# Patient Record
Sex: Female | Born: 1975 | Race: Black or African American | Hispanic: No | Marital: Single | Smoking: Never smoker
Health system: Southern US, Community
[De-identification: ages and names within clinical notes are randomized; demographics above are authoritative.]

## PROBLEM LIST (undated history)

## (undated) DIAGNOSIS — D259 Leiomyoma of uterus, unspecified: Secondary | ICD-10-CM

## (undated) DIAGNOSIS — I10 Essential (primary) hypertension: Secondary | ICD-10-CM

## (undated) DIAGNOSIS — K5790 Diverticulosis of intestine, part unspecified, without perforation or abscess without bleeding: Secondary | ICD-10-CM

## (undated) DIAGNOSIS — J45909 Unspecified asthma, uncomplicated: Secondary | ICD-10-CM

## (undated) DIAGNOSIS — E119 Type 2 diabetes mellitus without complications: Secondary | ICD-10-CM

## (undated) HISTORY — DX: Leiomyoma of uterus, unspecified: D25.9

## (undated) HISTORY — PX: HYSTEROTOMY: SHX1776

## (undated) HISTORY — PX: UTERINE FIBROID SURGERY: SHX826

## (undated) HISTORY — DX: Diverticulosis of intestine, part unspecified, without perforation or abscess without bleeding: K57.90

## (undated) HISTORY — DX: Type 2 diabetes mellitus without complications: E11.9

## (undated) HISTORY — PX: HERNIA REPAIR: SHX51

## (undated) HISTORY — DX: Essential (primary) hypertension: I10

## (undated) HISTORY — DX: Unspecified asthma, uncomplicated: J45.909

---

## 2001-03-13 ENCOUNTER — Encounter: Payer: Self-pay | Admitting: Family Medicine

## 2001-03-13 ENCOUNTER — Encounter: Admission: RE | Admit: 2001-03-13 | Discharge: 2001-03-13 | Payer: Self-pay | Admitting: Family Medicine

## 2001-06-15 ENCOUNTER — Encounter: Payer: Self-pay | Admitting: Family Medicine

## 2001-06-15 ENCOUNTER — Encounter: Admission: RE | Admit: 2001-06-15 | Discharge: 2001-06-15 | Payer: Self-pay | Admitting: Family Medicine

## 2001-07-02 ENCOUNTER — Other Ambulatory Visit: Admission: RE | Admit: 2001-07-02 | Discharge: 2001-07-02 | Payer: Self-pay | Admitting: Gynecology

## 2002-02-19 ENCOUNTER — Encounter: Admission: RE | Admit: 2002-02-19 | Discharge: 2002-02-19 | Payer: Self-pay | Admitting: Family Medicine

## 2002-02-19 ENCOUNTER — Encounter: Payer: Self-pay | Admitting: Family Medicine

## 2009-05-08 ENCOUNTER — Encounter: Payer: Self-pay | Admitting: Obstetrics and Gynecology

## 2009-05-08 ENCOUNTER — Ambulatory Visit (HOSPITAL_COMMUNITY): Admission: RE | Admit: 2009-05-08 | Discharge: 2009-05-09 | Payer: Self-pay | Admitting: Obstetrics and Gynecology

## 2010-08-28 LAB — CBC
HCT: 30.6 % — ABNORMAL LOW (ref 36.0–46.0)
HCT: 36.1 % (ref 36.0–46.0)
Hemoglobin: 11.5 g/dL — ABNORMAL LOW (ref 12.0–15.0)
Hemoglobin: 9.9 g/dL — ABNORMAL LOW (ref 12.0–15.0)
MCHC: 31.7 g/dL (ref 30.0–36.0)
MCHC: 32.2 g/dL (ref 30.0–36.0)
MCV: 77.1 fL — ABNORMAL LOW (ref 78.0–100.0)
MCV: 78.2 fL (ref 78.0–100.0)
Platelets: 339 10*3/uL (ref 150–400)
Platelets: 350 10*3/uL (ref 150–400)
RBC: 3.92 MIL/uL (ref 3.87–5.11)
RBC: 4.68 MIL/uL (ref 3.87–5.11)
RDW: 17.4 % — ABNORMAL HIGH (ref 11.5–15.5)
RDW: 17.6 % — ABNORMAL HIGH (ref 11.5–15.5)
WBC: 10.1 10*3/uL (ref 4.0–10.5)
WBC: 7.2 10*3/uL (ref 4.0–10.5)

## 2010-08-28 LAB — PREGNANCY, URINE: Preg Test, Ur: NEGATIVE

## 2016-11-08 DIAGNOSIS — J4520 Mild intermittent asthma, uncomplicated: Secondary | ICD-10-CM | POA: Diagnosis not present

## 2016-11-08 DIAGNOSIS — Z6836 Body mass index (BMI) 36.0-36.9, adult: Secondary | ICD-10-CM | POA: Diagnosis not present

## 2016-11-08 DIAGNOSIS — I10 Essential (primary) hypertension: Secondary | ICD-10-CM | POA: Diagnosis not present

## 2016-11-08 DIAGNOSIS — E119 Type 2 diabetes mellitus without complications: Secondary | ICD-10-CM | POA: Diagnosis not present

## 2017-04-28 DIAGNOSIS — R110 Nausea: Secondary | ICD-10-CM | POA: Diagnosis not present

## 2017-04-28 DIAGNOSIS — I10 Essential (primary) hypertension: Secondary | ICD-10-CM | POA: Diagnosis not present

## 2017-04-28 DIAGNOSIS — R5381 Other malaise: Secondary | ICD-10-CM | POA: Diagnosis not present

## 2017-04-28 DIAGNOSIS — J4520 Mild intermittent asthma, uncomplicated: Secondary | ICD-10-CM | POA: Diagnosis not present

## 2017-04-30 DIAGNOSIS — J029 Acute pharyngitis, unspecified: Secondary | ICD-10-CM | POA: Diagnosis not present

## 2017-05-01 DIAGNOSIS — J4520 Mild intermittent asthma, uncomplicated: Secondary | ICD-10-CM | POA: Diagnosis not present

## 2017-05-01 DIAGNOSIS — J00 Acute nasopharyngitis [common cold]: Secondary | ICD-10-CM | POA: Diagnosis not present

## 2017-06-20 DIAGNOSIS — I10 Essential (primary) hypertension: Secondary | ICD-10-CM | POA: Diagnosis not present

## 2017-06-20 DIAGNOSIS — J4520 Mild intermittent asthma, uncomplicated: Secondary | ICD-10-CM | POA: Diagnosis not present

## 2017-06-20 DIAGNOSIS — E119 Type 2 diabetes mellitus without complications: Secondary | ICD-10-CM | POA: Diagnosis not present

## 2017-06-20 DIAGNOSIS — Z6836 Body mass index (BMI) 36.0-36.9, adult: Secondary | ICD-10-CM | POA: Diagnosis not present

## 2017-08-08 DIAGNOSIS — H5213 Myopia, bilateral: Secondary | ICD-10-CM | POA: Diagnosis not present

## 2017-08-08 DIAGNOSIS — E119 Type 2 diabetes mellitus without complications: Secondary | ICD-10-CM | POA: Diagnosis not present

## 2017-08-08 DIAGNOSIS — H04123 Dry eye syndrome of bilateral lacrimal glands: Secondary | ICD-10-CM | POA: Diagnosis not present

## 2017-09-26 DIAGNOSIS — Z1231 Encounter for screening mammogram for malignant neoplasm of breast: Secondary | ICD-10-CM | POA: Diagnosis not present

## 2017-09-26 DIAGNOSIS — J4520 Mild intermittent asthma, uncomplicated: Secondary | ICD-10-CM | POA: Diagnosis not present

## 2017-09-26 DIAGNOSIS — I10 Essential (primary) hypertension: Secondary | ICD-10-CM | POA: Diagnosis not present

## 2017-09-26 DIAGNOSIS — E119 Type 2 diabetes mellitus without complications: Secondary | ICD-10-CM | POA: Diagnosis not present

## 2017-10-22 DIAGNOSIS — L98491 Non-pressure chronic ulcer of skin of other sites limited to breakdown of skin: Secondary | ICD-10-CM | POA: Diagnosis not present

## 2017-10-22 DIAGNOSIS — I10 Essential (primary) hypertension: Secondary | ICD-10-CM | POA: Diagnosis not present

## 2017-10-22 DIAGNOSIS — R1032 Left lower quadrant pain: Secondary | ICD-10-CM | POA: Diagnosis not present

## 2017-10-30 DIAGNOSIS — K5730 Diverticulosis of large intestine without perforation or abscess without bleeding: Secondary | ICD-10-CM | POA: Diagnosis not present

## 2017-10-30 DIAGNOSIS — R1032 Left lower quadrant pain: Secondary | ICD-10-CM | POA: Diagnosis not present

## 2017-11-03 DIAGNOSIS — T8130XD Disruption of wound, unspecified, subsequent encounter: Secondary | ICD-10-CM | POA: Diagnosis not present

## 2017-11-03 DIAGNOSIS — L98499 Non-pressure chronic ulcer of skin of other sites with unspecified severity: Secondary | ICD-10-CM | POA: Diagnosis not present

## 2017-11-03 DIAGNOSIS — L98492 Non-pressure chronic ulcer of skin of other sites with fat layer exposed: Secondary | ICD-10-CM | POA: Diagnosis not present

## 2017-11-03 DIAGNOSIS — E11622 Type 2 diabetes mellitus with other skin ulcer: Secondary | ICD-10-CM | POA: Diagnosis not present

## 2017-11-04 DIAGNOSIS — L98492 Non-pressure chronic ulcer of skin of other sites with fat layer exposed: Secondary | ICD-10-CM | POA: Diagnosis not present

## 2017-12-19 DIAGNOSIS — I10 Essential (primary) hypertension: Secondary | ICD-10-CM | POA: Diagnosis not present

## 2017-12-19 DIAGNOSIS — Z7984 Long term (current) use of oral hypoglycemic drugs: Secondary | ICD-10-CM | POA: Diagnosis not present

## 2017-12-19 DIAGNOSIS — E119 Type 2 diabetes mellitus without complications: Secondary | ICD-10-CM | POA: Diagnosis not present

## 2017-12-19 DIAGNOSIS — Z6835 Body mass index (BMI) 35.0-35.9, adult: Secondary | ICD-10-CM | POA: Diagnosis not present

## 2017-12-26 MED FILL — METFORMIN HCL ER 500 MG TAB: 500 | 90 days supply | Qty: 360 | Fill #0

## 2017-12-29 MED FILL — OLMESARTAN-HCTZ 40-25 MG TA: 40-25 | 90 days supply | Qty: 90 | Fill #0

## 2018-03-27 MED FILL — metFORMIN HCL ER 500 MG TB2: 500 | 90 days supply | Qty: 360 | Fill #1

## 2018-03-27 MED FILL — OLMESARTAN-HCTZ 40-25 MG TA: 40-25 | 90 days supply | Qty: 90 | Fill #1

## 2018-05-08 DIAGNOSIS — R1032 Left lower quadrant pain: Secondary | ICD-10-CM | POA: Diagnosis not present

## 2018-05-08 DIAGNOSIS — L98491 Non-pressure chronic ulcer of skin of other sites limited to breakdown of skin: Secondary | ICD-10-CM | POA: Diagnosis not present

## 2018-05-08 DIAGNOSIS — Z7984 Long term (current) use of oral hypoglycemic drugs: Secondary | ICD-10-CM | POA: Diagnosis not present

## 2018-05-08 DIAGNOSIS — E119 Type 2 diabetes mellitus without complications: Secondary | ICD-10-CM | POA: Diagnosis not present

## 2018-05-08 DIAGNOSIS — I10 Essential (primary) hypertension: Secondary | ICD-10-CM | POA: Diagnosis not present

## 2018-05-08 DIAGNOSIS — Z6835 Body mass index (BMI) 35.0-35.9, adult: Secondary | ICD-10-CM | POA: Diagnosis not present

## 2018-05-21 ENCOUNTER — Encounter: Payer: Self-pay | Admitting: Gastroenterology

## 2018-05-23 ENCOUNTER — Encounter (HOSPITAL_BASED_OUTPATIENT_CLINIC_OR_DEPARTMENT_OTHER): Payer: Self-pay

## 2018-05-26 ENCOUNTER — Encounter (HOSPITAL_BASED_OUTPATIENT_CLINIC_OR_DEPARTMENT_OTHER): Payer: Self-pay

## 2018-05-26 ENCOUNTER — Encounter (HOSPITAL_BASED_OUTPATIENT_CLINIC_OR_DEPARTMENT_OTHER): Payer: 59 | Attending: Internal Medicine

## 2018-05-26 DIAGNOSIS — Y838 Other surgical procedures as the cause of abnormal reaction of the patient, or of later complication, without mention of misadventure at the time of the procedure: Secondary | ICD-10-CM | POA: Diagnosis not present

## 2018-05-26 DIAGNOSIS — E119 Type 2 diabetes mellitus without complications: Secondary | ICD-10-CM | POA: Insufficient documentation

## 2018-05-26 DIAGNOSIS — T8189XA Other complications of procedures, not elsewhere classified, initial encounter: Secondary | ICD-10-CM | POA: Diagnosis not present

## 2018-05-26 DIAGNOSIS — Z90710 Acquired absence of both cervix and uterus: Secondary | ICD-10-CM | POA: Diagnosis not present

## 2018-05-26 DIAGNOSIS — I10 Essential (primary) hypertension: Secondary | ICD-10-CM | POA: Diagnosis not present

## 2018-05-26 DIAGNOSIS — T8131XA Disruption of external operation (surgical) wound, not elsewhere classified, initial encounter: Secondary | ICD-10-CM | POA: Diagnosis not present

## 2018-05-26 DIAGNOSIS — Z9889 Other specified postprocedural states: Secondary | ICD-10-CM | POA: Diagnosis not present

## 2018-05-28 DIAGNOSIS — L98492 Non-pressure chronic ulcer of skin of other sites with fat layer exposed: Secondary | ICD-10-CM | POA: Diagnosis not present

## 2018-06-02 ENCOUNTER — Encounter (HOSPITAL_BASED_OUTPATIENT_CLINIC_OR_DEPARTMENT_OTHER): Payer: 59 | Attending: Internal Medicine

## 2018-06-18 NOTE — Progress Notes (Deleted)
[facility] Consult Note:  History: Theresa Briggs 06/19/2018  Referring physician: No primary care provider on file.  Reason for consult/chief complaint: No chief complaint on file.   Subjective  HPI:  ***  Referred by her [facility] primary care provider after December 2019 office visit complaining of intermittent left lower quadrant pain.  That note indicates "negative GYN work-up". ROS:  Review of Systems   Past Medical History: Past Medical History:  Diagnosis Date  . Asthma   . Hypertension   . Type 2 diabetes mellitus ([place])   . Uterine fibroid      Past Surgical History: *** The histories are not reviewed yet. Please review them in the "History" navigator section and refresh this K[place].   Family History: Family History  Problem Relation Age of Onset  . Diabetes Mother   . Hypertension Mother   . Hypertension Father   . Diabetes Paternal Grandmother     Social History: Social History   Socioeconomic History  . Marital status: Single    Spouse name: Not on file  . Number of children: Not on file  . Years of education: Not on file  . Highest education level: Not on file  Occupational History  . Not on file  Social Needs  . Financial resource strain: Not on file  . Food insecurity:    Worry: Not on file    Inability: Not on file  . Transportation needs:    Medical: Not on file    Non-medical: Not on file  Tobacco Use  . Smoking status: Never Smoker  . Smokeless tobacco: Never Used  Substance and Sexual Activity  . Alcohol use: Not on file  . Drug use: Not on file  . Sexual activity: Not on file  Lifestyle  . Physical activity:    Days per week: Not on file    Minutes per session: Not on file  . Stress: Not on file  Relationships  . Social connections:    Talks on phone: Not on file    Gets together: Not on file    Attends religious service: Not on file    Active member of club or organization: Not on file   Attends meetings of clubs or organizations: Not on file    Relationship status: Not on file  Other Topics Concern  . Not on file  Social History Narrative  . Not on file    Allergies: Allergies not on file  Outpatient Meds: No current outpatient medications on file.   No current facility-administered medications for this visit.       ___________________________________________________________________ Objective   Exam:  There were no vitals taken for this visit.   General: this is a(n) ***   Eyes: sclera anicteric, no redness  ENT: oral mucosa moist without lesions, no cervical or supraclavicular lymphadenopathy  CV: RRR without murmur, S1/S2, no JVD, no peripheral edema  Resp: clear to auscultation bilaterally, normal RR and effort noted  GI: soft, *** tenderness, with active bowel sounds. No guarding or palpable organomegaly noted.  Skin; warm and dry, no rash or jaundice noted  Neuro: awake, alert and oriented x 3. Normal gross motor function and fluent speech  Labs:  Recent hemoglobin A1c 6.3 (similar values over several years)  01/13/2018 CBC: Hemoglobin 12, MCV 76  Radiologic Studies:  CT abdomen and pelvis June 2019 (through care everywhere)  CLINICAL DATA: Left lower quadrant pain for 5 days  EXAM: CT ABDOMEN AND PELVIS WITH CONTRAST  TECHNIQUE: Multidetector CT imaging of the abdomen and pelvis was performed using the standard protocol following bolus administration of intravenous contrast.  CONTRAST: 100 mL of Omnipaque 350  COMPARISON: None.  FINDINGS: Lower chest: No acute abnormality.  Hepatobiliary: Hepatic steatosis. No focal mass. The gallbladder is decompressed but unremarkable. The portal vein is patent.  Pancreas: Unremarkable. No pancreatic ductal dilatation or surrounding inflammatory changes.  Spleen: Normal in size without focal abnormality.  Adrenals/Urinary Tract: Adrenal glands are unremarkable. Kidneys  are normal, without renal calculi, focal lesion, or hydronephrosis. Bladder is unremarkable.  Stomach/Bowel: The stomach and small bowel are normal. Colonic diverticulosis is seen without diverticulitis. The appendix is normal.  Vascular/Lymphatic: No significant vascular findings are present. No enlarged abdominal or pelvic lymph nodes.  Reproductive: Status post hysterectomy. No adnexal masses.  Other: Previous hernia repair scarring in the anterior abdominal wall. No free air or free fluid.  Musculoskeletal: No acute or significant osseous findings.  IMPRESSION: 1. No cause for left lower quadrant pain identified. No diverticulitis. Diverticulosis. 2. Signs of previous abdominal hernia repair.   Electronically Signed  By: Dorise Bullion III M.D  On: 10/30/2017 15:59  Assessment: No diagnosis found.  ***  Plan:  ***  Thank you for the courtesy of this consult.  Please call me with any questions or concerns.  Nelida Meuse III  CC: Referring provider noted above

## 2018-06-19 ENCOUNTER — Ambulatory Visit: Payer: Self-pay | Admitting: Gastroenterology

## 2018-07-02 MED FILL — metFORMIN HCL ER 500 MG TB2: 500 | 90 days supply | Qty: 360 | Fill #2 | Status: TO

## 2018-07-02 MED FILL — OLMESARTAN-HCTZ 40-25 MG TA: 40-25 | 90 days supply | Qty: 90 | Fill #2 | Status: TO

## 2018-08-03 ENCOUNTER — Encounter: Payer: Self-pay | Admitting: Gastroenterology

## 2018-08-18 ENCOUNTER — Other Ambulatory Visit: Payer: Self-pay

## 2018-08-18 ENCOUNTER — Telehealth (INDEPENDENT_AMBULATORY_CARE_PROVIDER_SITE_OTHER): Payer: 59 | Admitting: Gastroenterology

## 2018-08-18 DIAGNOSIS — R1032 Left lower quadrant pain: Secondary | ICD-10-CM

## 2018-08-18 NOTE — Patient Instructions (Addendum)
We have sent the following medications to your pharmacy for you to pick up at your convenience: Levsin 0.125mg  as needed every 4 hours as needed.  We will contact you to schedule your EGD/Colonoscopy in 4-6 weeks.  It was a pleasure to see you today!  Vito Cirigliano, D.O.

## 2018-08-18 NOTE — Progress Notes (Signed)
              Chief Complaint: Abdominal pain, LLQ   Referring Provider:     Waverly Ferrari, MD   HPI:    Due to current restrictions/limitations of in office visits due to COVID-19, this scheduled clinical appointment was converted to a telehealth consultation via telephone.  -Time of medical discussion: 22 minutes -Patient consented to the consult via telephone -Names of all parties present: Theresa Briggs (patient), Gerrit Heck, DO, [facility] (physician)  Theresa Briggs is a 43 y.o. female referred to the Gastroenterology Clinic for evaluation of LLQ pain x1+ year. Pain is intermittent and unrelated to PO intake, activity, time of day, etc. Described as sharp pain. Prescribed Mobic 7.5 mg, initially with clinical improvement, but now no improvement. Sxs occur at random, lasting 30 mins. CT in 10/2017 with diverticulosis only. No known triggers. No radiation. No associated n/v/d/c/f/c. No hematochezia or melena.   Has had fibroid surgery x2, abdominal hernia repair, then hysterectomy for recurrence of fibroids. Labs in 09/2017 with mild microcytic anemia (11.9/35.4 with MCV 76), normal CMP in 04/2018. No previous colonoscopy. Had an EGD many years ago and unrelated to this issue.   No known family history of CRC, GI malignancy, liver disease, pancreatic disease, or IBD.   Past medical history, past surgical history, social history, family history, medications, and allergies reviewed in the chart and with patient over the phone.  Past Medical History:  Diagnosis Date  . Asthma   . Hypertension   . Type 2 diabetes mellitus ([place])   . Uterine fibroid       Family History  Problem Relation Age of Onset  . Diabetes Mother   . Hypertension Mother   . Hypertension Father   . Diabetes Paternal Grandmother    Social History   Tobacco Use  . Smoking status: Never Smoker  . Smokeless tobacco: Never Used  Substance Use Topics  . Alcohol use: Not on file  . Drug use: Not on file    No current outpatient medications on file.   No current facility-administered medications for this visit.    Not on File   Review of Systems: All systems reviewed and negative except where noted in HPI.     Physical Exam:    Physical exam not completed due to the nature of this telehealth communication.  Patient was otherwise alert and oriented and well communicative.   ASSESSMENT AND PLAN;   [name] is a 43 y.o. female presenting with:  1) LLQ Pain: Intermittent, nonlimiting LLQ pain without associated symptoms over the last year plus.  Discussed the broad DDX to include GI and GU etiologies along with MSK etiologies and will plan to evaluate and treat further as below: - Trial course of Levsin for diagnostic and therapeutic intent - Check inflammatory markers - No evidence of diverticulitis noted on CT at the time of active symptoms last year.  Have a low threshold if this is recurrent diverticulitis or SCAD. - Discussed colonoscopy to evaluate for additional mucosal/luminal etiology.  Given the current endoscopic limitations due to COVID-19 pandemic, plan to do colonoscopy in 4 to 6 weeks when limitations are expected to cease.  Given chronic nature and otherwise feeling well, no need to expedite as urgent/emergent. -If GI evaluation unrevealing, potentially refer to GYN for additional evaluation   Dominic Pea Gradie Ohm, DO, FACG  08/18/2018, 5:13 PM   No ref. provider found

## 2018-08-19 ENCOUNTER — Other Ambulatory Visit: Payer: 59

## 2018-08-21 ENCOUNTER — Other Ambulatory Visit: Payer: Self-pay

## 2018-08-21 ENCOUNTER — Other Ambulatory Visit: Payer: Self-pay | Admitting: Gastroenterology

## 2018-08-21 MED ORDER — HYOSCYAMINE SULFATE 0.125 MG SL SUBL: 0.1250 mg | SUBLINGUAL_TABLET | SUBLINGUAL | 1 refills | Status: DC | PRN

## 2018-08-21 NOTE — Progress Notes (Signed)
Rx sent to pharmacy   

## 2018-08-21 NOTE — Telephone Encounter (Signed)
Medication has been sent into patients pharmacy 

## 2018-08-21 NOTE — Telephone Encounter (Signed)
Pt had televisit with pt and was told that Levsin was going to be sent to her pharmacy but it has not yet. She uses [facility]  on [address] in [place].

## 2018-08-27 NOTE — Telephone Encounter (Signed)
Please advise 

## 2018-08-27 NOTE — Telephone Encounter (Signed)
Pt stated that the Levsin prescribed works "as a quick fix but does not cover her the entire 4 hours." She reported that she still gets the same pain.

## 2018-08-28 ENCOUNTER — Telehealth: Payer: Self-pay | Admitting: Gastroenterology

## 2018-08-31 ENCOUNTER — Other Ambulatory Visit (INDEPENDENT_AMBULATORY_CARE_PROVIDER_SITE_OTHER): Payer: 59

## 2018-08-31 ENCOUNTER — Other Ambulatory Visit: Payer: Self-pay

## 2018-08-31 DIAGNOSIS — R1032 Left lower quadrant pain: Secondary | ICD-10-CM

## 2018-08-31 LAB — SEDIMENTATION RATE: Sed Rate: 34 mm/hr — ABNORMAL HIGH (ref 0–20)

## 2018-08-31 LAB — C-REACTIVE PROTEIN: CRP: 1.4 mg/dL (ref 0.5–20.0)

## 2018-08-31 MED FILL — metFORMIN HCL ER 500 MG TB2: 500 | 90 days supply | Qty: 360 | Fill #0

## 2018-08-31 MED FILL — OLMESARTAN-HCTZ 40-25 MG TA: 40-25 | 90 days supply | Qty: 90 | Fill #0

## 2018-08-31 NOTE — Telephone Encounter (Signed)
I called Ms. Zeiger.  States that the [place] works, but only lasts for about an hour, then return of index LLQ pain.  Still no association with p.o. intake or bowel habits.  Otherwise in her usual state of health.  - Increase dose to 0.25 mg taken every 4-6 hours for pain -Advised to go to the lab for ESR/CRP as previously ordered - If inflammatory markers normal pain continues despite higher dose of Levsin, plan to change to alternate medication (i.e. Bentyl). -As previously discussed, plan on endoscopic evaluation once current restrictions related to COVID-19 pandemic are listed - Symptoms not currently related to p.o. intake and not making any dietary modifications, but would likely benefit from low FODMAP diet. - If symptoms still somewhat refractory, plan for in office exam followed by consideration for referral to GYN for alternate etiology and back to [facility] for reconsideration of MSK etiology

## 2018-08-31 NOTE — Telephone Encounter (Signed)
Pt stated that Levsin is not helping and that she is in immense pain. Please return her call.

## 2018-08-31 NOTE — Telephone Encounter (Signed)
Dr. Bryan Lemma spoke with the patient.  She thanked me for the call

## 2018-09-09 ENCOUNTER — Telehealth: Payer: Self-pay | Admitting: Gastroenterology

## 2018-09-10 NOTE — Telephone Encounter (Signed)
Patient called with c/o still having bad pain in her LLQ intermittently even with the Hyoscyamine. States she has even been taking 2 tabs at a time with no relief. Took  2 tabs twice yesterday, but none today so far. Offered patient a virtual phone visit with you this afternoon, but she did not want that. Would like to know if she can do a higher dose or try a different med. Please advise

## 2018-09-14 ENCOUNTER — Telehealth: Payer: Self-pay | Admitting: Gastroenterology

## 2018-09-14 NOTE — Telephone Encounter (Signed)
Hughie Closs, RN       11:20 AM  Note    Patient called with c/o still having bad pain in her LLQ intermittently even with the Hyoscyamine. States she has even been taking 2 tabs at a time with no relief. Took  2 tabs twice yesterday, but none today so far. Offered patient a virtual phone visit with you this afternoon, but she did not want that. Would like to know if she can do a higher dose or try a different med. Please advise     Patient calling back again, see note above. Please advise.

## 2018-09-14 NOTE — Telephone Encounter (Signed)
Called [place] this afternoon.  She is continuing to have intermittent LLQ pain, unchanged in location, intensity from previous.  Pain is been present for more than a year now.  CT in 09/2017 at the time of pain was only notable for diverticulosis, without diverticulitis.  She has trialed Levsin for a couple weeks now, without any clinical improvement.  Only has very limited pain relief with Rx.  Mildly elevated ESR with normal CRP.  Otherwise tolerating p.o. intake.  No fevers or chills.  Discussed the DDX for her intermittent LLQ pain to include abdominal wall syndrome, IBS, GYN etiology, and will proceed as below:  - In office exam with me next week (currently in the hospital week) -Stop Levsin -Start Bentyl 20 mg every 6 hours as needed for abdominal pain.  #60.  Refill 1. - Please place a referral to GYN for evaluation - Exam next week unrevealing and pending GYN evaluation, may need to consider cross-sectional imaging or abdominal ultrasound - If ongoing symptoms, plan for colonoscopy when able following COVID-19 pandemic restrictions -All questions answered

## 2018-09-15 ENCOUNTER — Other Ambulatory Visit: Payer: Self-pay

## 2018-09-15 DIAGNOSIS — R1032 Left lower quadrant pain: Secondary | ICD-10-CM

## 2018-09-15 MED ORDER — DICYCLOMINE HCL 10 MG PO CAPS: 20.0000 mg | ORAL_CAPSULE | Freq: Four times a day (QID) | ORAL | 1 refills | Status: DC | PRN

## 2018-09-15 NOTE — Telephone Encounter (Signed)
Called patient and notified to stop Levsin. Order put in for Bentyl and patient advised to pick-up. Referral made to [facility] with Dr. Basil Dess for 09/24/18 @11 :30am

## 2018-09-17 ENCOUNTER — Other Ambulatory Visit: Payer: Self-pay

## 2018-09-17 ENCOUNTER — Ambulatory Visit: Payer: Self-pay | Admitting: Gynecology

## 2018-09-18 ENCOUNTER — Ambulatory Visit: Payer: Self-pay | Admitting: Gynecology

## 2018-09-22 ENCOUNTER — Ambulatory Visit: Payer: 59 | Admitting: Gastroenterology

## 2018-09-23 NOTE — Progress Notes (Signed)
Called patient reference her cancelling her GYN referral. She stated she has been feeling much better since being on the Bentyl and so she didn't think she needed to see a GYN Dr. Now.

## 2018-09-25 ENCOUNTER — Ambulatory Visit: Payer: Self-pay | Admitting: Gynecology

## 2018-10-06 ENCOUNTER — Ambulatory Visit: Payer: 59 | Admitting: Gastroenterology

## 2018-12-23 ENCOUNTER — Ambulatory Visit: Payer: 59 | Admitting: Gastroenterology

## 2019-01-13 MED FILL — OLMESARTAN-HCTZ 40-25 MG TA: 40-25 | 90 days supply | Qty: 90 | Fill #0

## 2019-01-15 MED FILL — metFORMIN HCL ER 500 MG TB2: 500 | 90 days supply | Qty: 360 | Fill #0

## 2019-01-19 ENCOUNTER — Ambulatory Visit: Payer: 59 | Admitting: Gastroenterology

## 2019-01-22 ENCOUNTER — Ambulatory Visit: Payer: 59 | Admitting: Gastroenterology

## 2019-02-16 ENCOUNTER — Encounter: Payer: Self-pay | Admitting: Gynecology

## 2019-02-16 ENCOUNTER — Telehealth: Payer: Self-pay | Admitting: Gastroenterology

## 2019-02-16 NOTE — Telephone Encounter (Signed)
Please review previous message and advise 

## 2019-02-16 NOTE — Telephone Encounter (Signed)
Pt stated that she suspects her abdominal pain is due to hernia mesh complications.  She would like to know Dr. Vivia Ewing advice.

## 2019-02-17 NOTE — Telephone Encounter (Signed)
Difficult to ascertain without an in-office exam. Would recommend scheduling appt for evaluation.

## 2019-02-17 NOTE — Telephone Encounter (Signed)
Called and spoke with patient-patient is agreeable with plan of care advised by Dr. Bryan Lemma- patient has been scheduled to see MD on 02/19/2019 at 3:40 pm at the [facility] office=patient verbalized understanding of information/instructions and that appt is at [facility] office; patient advised to call back to the office should questions/concerns arise;

## 2019-02-19 ENCOUNTER — Ambulatory Visit: Payer: 59 | Admitting: Gastroenterology

## 2019-02-19 ENCOUNTER — Encounter: Payer: Self-pay | Admitting: *Deleted

## 2019-02-19 VITALS — BP 122/80 | HR 93 | Temp 99.5°F | Ht 65.0 in | Wt 219.5 lb

## 2019-02-19 DIAGNOSIS — R1032 Left lower quadrant pain: Secondary | ICD-10-CM

## 2019-02-19 DIAGNOSIS — Z90710 Acquired absence of both cervix and uterus: Secondary | ICD-10-CM | POA: Diagnosis not present

## 2019-02-19 NOTE — Patient Instructions (Signed)
You have been scheduled for an appointment with ____________ at [facility]. Your appointment is on ______________ at ___________. Please arrive at _______________ for registration. Make certain to bring a list of current medications, including any over the counter medications or vitamins. Also bring your co-pay if you have one as well as your insurance cards. [facility] is located at [address], [address]. Should you need to reschedule your appointment, please contact them at [phone].  If you are age 20 or older, your body mass index should be between 23-30. Your Body mass index is 36.53 kg/m. If this is out of the aforementioned range listed, please consider follow up with your Primary Care Provider.  If you are age 63 or younger, your body mass index should be between 19-25. Your Body mass index is 36.53 kg/m. If this is out of the aformentioned range listed, please consider follow up with your Primary Care Provider.

## 2019-02-19 NOTE — Progress Notes (Signed)
P  Chief Complaint:    LLQ Pain  GI History: 43 year old female with a history of asthma, hypertension, diabetes, uterine fibroids, follows in the GI clinic for LLQ pain >1 year. Pain is intermittent and unrelated to PO intake, activity, time of day, etc. Described as sharp pain.  Symptoms occur at random, lasting 30-45 minutes. CT in 10/2017 with diverticulosis only. No known triggers. No radiation. No associated n/v/d/c/f/c. No hematochezia or melena.  Has trialed Mobic, Levsin, Bentyl.    Has had fibroid surgery x2 [facility]), abdominal hernia repair with mesh 2012, then hysterectomy, LOA for recurrence of fibroids in 2013 at [facility].  Was referred back to [facility] in 08/2018.  HPI:    Patient is a 43 y.o. female presenting to the Gastroenterology Clinic for follow-up.  Initially seen by me in 07/2018 (telehealth visit).   Today, she states that she continues to have intermittent LLQ, without any improvement with meds trialed as above. Pain now lasting longer, 30-45 minutes (previous 10-15 mins). Has trialed heating pads (some improvement) and APAP (no change). Still no changes in bowel habits. Sxs occur a fw days per week. Still no exacerbating factors; only occurs at random.  Pain not reproducible.  Still no fever, chills, night sweats, nausea, vomiting.  Otherwise, no new labs or imaging for review today.  Review of systems:     No chest pain, no SOB, no fevers, no urinary sx   Past Medical History:  Diagnosis Date  . Asthma   . Diverticulosis   . Hypertension   . Type 2 diabetes mellitus ([place])   . Uterine fibroid     Patient's surgical history, family medical history, social history, medications and allergies were all reviewed in Epic    Current Outpatient Medications  Medication Sig Dispense Refill  . albuterol (VENTOLIN HFA) 108 (90 Base) MCG/ACT inhaler Inhale into the lungs.    . dicyclomine (BENTYL) 10 MG capsule Take 2 capsules (20 mg total) by mouth  every 6 (six) hours as needed for spasms (every 6 hours as  needed for abdominal pain). 60 capsule 1  . metFORMIN (GLUCOPHAGE-XR) 500 MG 24 hr tablet Take by mouth.    . olmesartan-hydrochlorothiazide (BENICAR HCT) 40-25 MG tablet Take by mouth.     No current facility-administered medications for this visit.     Physical Exam:     BP 122/80 (BP Location: Left Arm, Patient Position: Sitting, Cuff Size: Normal)   Pulse 93   Temp 99.5 F (37.5 C) (Oral)   Ht 5\' 5"  (1.651 m)   Wt 219 lb 8 oz (99.6 kg)   BMI 36.53 kg/m   GENERAL:  Pleasant female in NAD PSYCH: : Cooperative, normal affect EENT:  conjunctiva pink, mucous membranes moist, neck supple without masses CARDIAC:  RRR, no murmur heard, no peripheral edema PULM: Normal respiratory effort, lungs CTA bilaterally, no wheezing ABDOMEN:  Nondistended, soft, nontender. No obvious masses, no hepatomegaly,  normal bowel sounds SKIN:  turgor, no lesions seen Musculoskeletal:  Normal muscle tone, normal strength NEURO: Alert and oriented x 3, no focal neurologic deficits   IMPRESSION and PLAN:    1) LLQ Pain 2) History of Fibroids 3) History of adhesive disease  - Intermittent LLQ pain that seemingly occurs at random.  Pain not reproducible and no associated GI symptoms.  Discussed DDX.   - Referral to [facility] for evaluation of LLQ pain and history of adhesive disease - Do not feel there is a strong  clinical indication for colonoscopy given lack of luminal/mucosal type GI sxs - Supportive care with heating pads prn ok - RTC prn      Dominic Pea Terrill Wauters ,DO, FACG 02/19/2019, 4:10 PM

## 2019-02-23 ENCOUNTER — Telehealth: Payer: Self-pay | Admitting: *Deleted

## 2019-02-23 DIAGNOSIS — R112 Nausea with vomiting, unspecified: Secondary | ICD-10-CM | POA: Diagnosis not present

## 2019-02-23 NOTE — Telephone Encounter (Signed)
Awaiting [facility] appointment. Sent 10 pages of records/demographics to Clarise Cruz at [phone].

## 2019-02-25 ENCOUNTER — Telehealth: Payer: Self-pay | Admitting: Gastroenterology

## 2019-02-25 NOTE — Telephone Encounter (Signed)
Advised patient that I did recently leave a voicemail for Theresa Briggs at [facility] regarding appointment but have not heard back. I will contact her again tomorrow morning for an update. She verbalizes understanding.

## 2019-02-26 DIAGNOSIS — Z7984 Long term (current) use of oral hypoglycemic drugs: Secondary | ICD-10-CM | POA: Diagnosis not present

## 2019-02-26 DIAGNOSIS — Z6835 Body mass index (BMI) 35.0-35.9, adult: Secondary | ICD-10-CM | POA: Diagnosis not present

## 2019-02-26 DIAGNOSIS — J4520 Mild intermittent asthma, uncomplicated: Secondary | ICD-10-CM | POA: Diagnosis not present

## 2019-02-26 DIAGNOSIS — I10 Essential (primary) hypertension: Secondary | ICD-10-CM | POA: Diagnosis not present

## 2019-02-26 DIAGNOSIS — R1032 Left lower quadrant pain: Secondary | ICD-10-CM | POA: Diagnosis not present

## 2019-02-26 DIAGNOSIS — Z114 Encounter for screening for human immunodeficiency virus [HIV]: Secondary | ICD-10-CM | POA: Diagnosis not present

## 2019-02-26 DIAGNOSIS — L98491 Non-pressure chronic ulcer of skin of other sites limited to breakdown of skin: Secondary | ICD-10-CM | POA: Diagnosis not present

## 2019-02-26 DIAGNOSIS — E119 Type 2 diabetes mellitus without complications: Secondary | ICD-10-CM | POA: Diagnosis not present

## 2019-03-01 NOTE — Telephone Encounter (Signed)
Patient is scheduled for an appointment with Dr Kieth Brightly at [facility] on 03/10/2019 at 10:30 am with a 10:15 am arrival.

## 2019-03-05 ENCOUNTER — Ambulatory Visit: Payer: 59 | Admitting: Gastroenterology

## 2019-03-30 DIAGNOSIS — R1032 Left lower quadrant pain: Secondary | ICD-10-CM | POA: Diagnosis not present

## 2019-04-02 ENCOUNTER — Other Ambulatory Visit: Payer: Self-pay | Admitting: Surgery

## 2019-04-02 DIAGNOSIS — R1032 Left lower quadrant pain: Secondary | ICD-10-CM

## 2019-04-16 ENCOUNTER — Other Ambulatory Visit: Payer: 59

## 2019-04-30 ENCOUNTER — Other Ambulatory Visit: Payer: 59

## 2019-05-04 MED FILL — OLMESARTAN-HCTZ 40-25 MG TA: 40-25 | 90 days supply | Qty: 90 | Fill #0

## 2019-05-04 MED FILL — METFORMIN HCL ER 500 MG TAB: 500 | 90 days supply | Qty: 360 | Fill #1

## 2019-05-07 ENCOUNTER — Ambulatory Visit
Admission: RE | Admit: 2019-05-07 | Discharge: 2019-05-07 | Disposition: A | Discharge status: Home / Self Care | Payer: 59 | Source: Ambulatory Visit | Attending: Surgery | Admitting: Surgery

## 2019-05-07 DIAGNOSIS — R1032 Left lower quadrant pain: Secondary | ICD-10-CM

## 2019-05-07 DIAGNOSIS — R109 Unspecified abdominal pain: Secondary | ICD-10-CM | POA: Diagnosis not present

## 2019-05-07 MED ORDER — IOPAMIDOL (ISOVUE-300) INJECTION 61%
125.0000 mL | Freq: Once | INTRAVENOUS | Status: AC | PRN
  Administered 2019-05-07: 125 mL via INTRAVENOUS

## 2019-05-26 DIAGNOSIS — J069 Acute upper respiratory infection, unspecified: Secondary | ICD-10-CM | POA: Diagnosis not present

## 2019-05-26 DIAGNOSIS — J4520 Mild intermittent asthma, uncomplicated: Secondary | ICD-10-CM | POA: Diagnosis not present

## 2019-05-26 DIAGNOSIS — E119 Type 2 diabetes mellitus without complications: Secondary | ICD-10-CM | POA: Diagnosis not present

## 2019-07-22 DIAGNOSIS — E119 Type 2 diabetes mellitus without complications: Secondary | ICD-10-CM | POA: Diagnosis not present

## 2019-07-22 DIAGNOSIS — H04123 Dry eye syndrome of bilateral lacrimal glands: Secondary | ICD-10-CM | POA: Diagnosis not present

## 2019-07-22 DIAGNOSIS — H5213 Myopia, bilateral: Secondary | ICD-10-CM | POA: Diagnosis not present

## 2019-08-20 MED FILL — metFORMIN HCL ER 500 MG TB2: 500 | 90 days supply | Qty: 360 | Fill #2

## 2019-08-20 MED FILL — OLMESARTAN-HCTZ 40-25 MG TA: 40-25 | 90 days supply | Qty: 90 | Fill #1

## 2019-08-27 ENCOUNTER — Ambulatory Visit: Payer: 59 | Attending: Internal Medicine

## 2019-08-30 DIAGNOSIS — I10 Essential (primary) hypertension: Secondary | ICD-10-CM | POA: Diagnosis not present

## 2019-08-30 DIAGNOSIS — Z0000 Encounter for general adult medical examination without abnormal findings: Secondary | ICD-10-CM | POA: Diagnosis not present

## 2019-08-30 DIAGNOSIS — L732 Hidradenitis suppurativa: Secondary | ICD-10-CM | POA: Diagnosis not present

## 2019-08-30 DIAGNOSIS — E119 Type 2 diabetes mellitus without complications: Secondary | ICD-10-CM | POA: Diagnosis not present

## 2019-08-30 DIAGNOSIS — D508 Other iron deficiency anemias: Secondary | ICD-10-CM | POA: Diagnosis not present

## 2019-08-30 DIAGNOSIS — J4520 Mild intermittent asthma, uncomplicated: Secondary | ICD-10-CM | POA: Diagnosis not present

## 2019-08-30 DIAGNOSIS — Z6835 Body mass index (BMI) 35.0-35.9, adult: Secondary | ICD-10-CM | POA: Diagnosis not present

## 2019-09-10 DIAGNOSIS — J4521 Mild intermittent asthma with (acute) exacerbation: Secondary | ICD-10-CM | POA: Diagnosis not present

## 2019-10-15 DIAGNOSIS — L732 Hidradenitis suppurativa: Secondary | ICD-10-CM | POA: Diagnosis not present

## 2019-11-24 ENCOUNTER — Other Ambulatory Visit (HOSPITAL_BASED_OUTPATIENT_CLINIC_OR_DEPARTMENT_OTHER): Payer: Self-pay | Admitting: Internal Medicine

## 2019-11-24 MED FILL — OLMESARTAN-HCTZ 40-25 MG TA: 40-25 | 90 days supply | Qty: 90 | Fill #0

## 2019-11-24 MED FILL — metFORMIN HCL ER 500 MG TB2: 500 | 90 days supply | Qty: 360 | Fill #3

## 2020-02-08 MED FILL — ALBUTEROL SULFATE HFA 108 (: 108 (90 BAS | 25 days supply | Qty: 18 | Fill #0

## 2020-02-25 IMAGING — CT CT ABD-PELV W/ CM
2 of 5 series · 14 of 46 positions shown, 16 images · IV contrast (iopamidol)
Comparison: CT 10/30/2017.

CLINICAL DATA: LEFT-sided abdominal pain for several years.
Hysterectomy.

EXAM:
CT ABDOMEN AND PELVIS WITH CONTRAST
TECHNIQUE: Multidetector CT imaging of the abdomen and pelvis was performed
using the standard protocol following bolus administration of
intravenous contrast.
CONTRAST:  125mL Q1G5KJ-GII IOPAMIDOL (Q1G5KJ-GII) INJECTION 61%

[Series 2: abd pelvis 5.00 br40 s3 axial · axial · 0.72mm/px · z∈[+1162,+1597]mm · 11 of 99 slices shown, 13 images]
[im 6/99  soft-tissue]
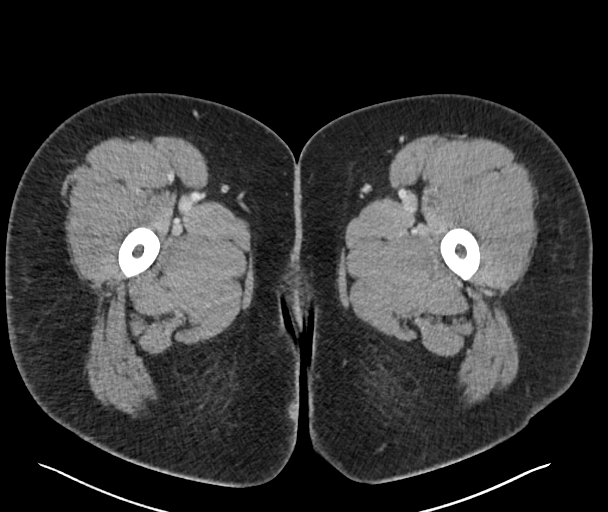
[im 6/99  bone]
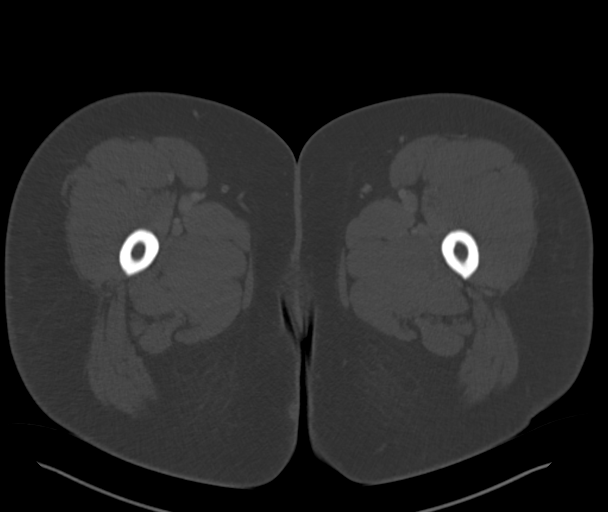
[im 16/99  soft-tissue]
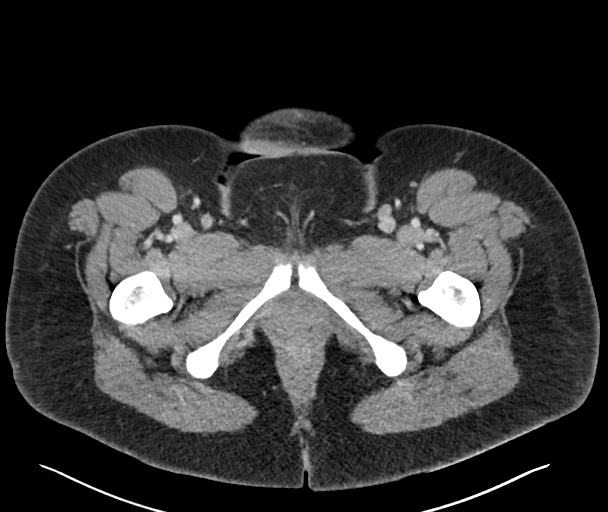
[im 26/99  soft-tissue]
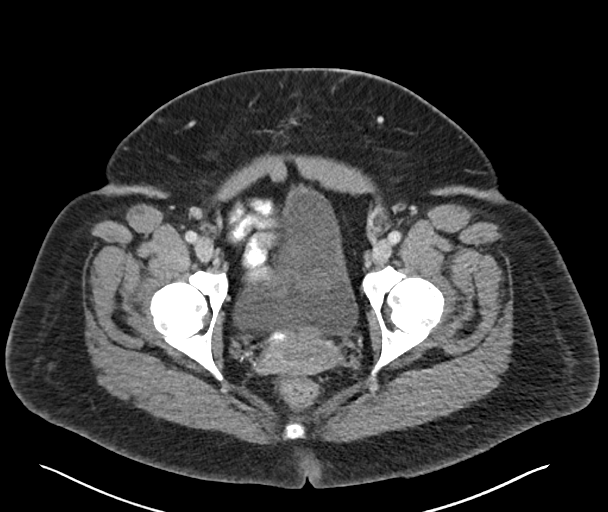
[im 31/99  soft-tissue]
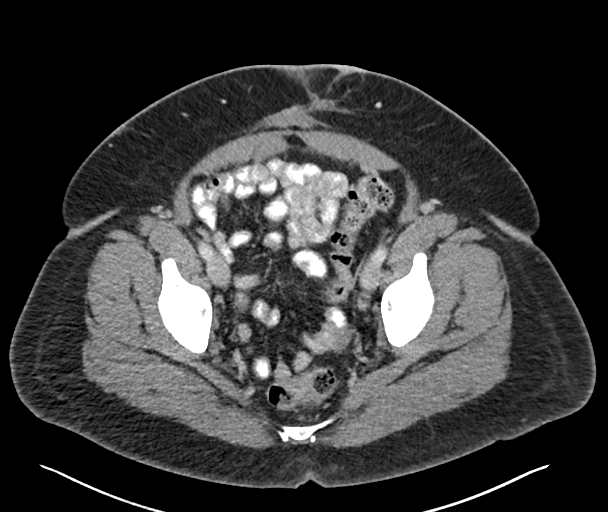
[im 42/99  soft-tissue]
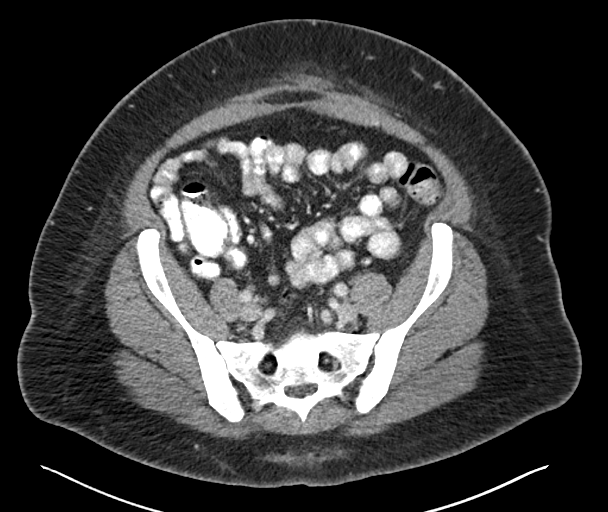
[im 52/99  soft-tissue]
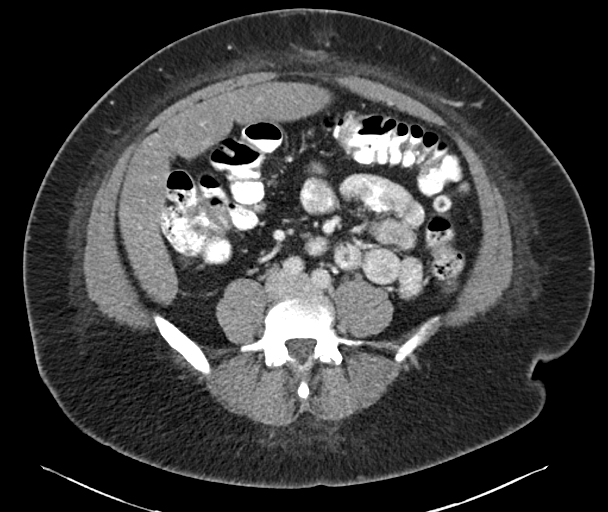
[im 57/99  soft-tissue]
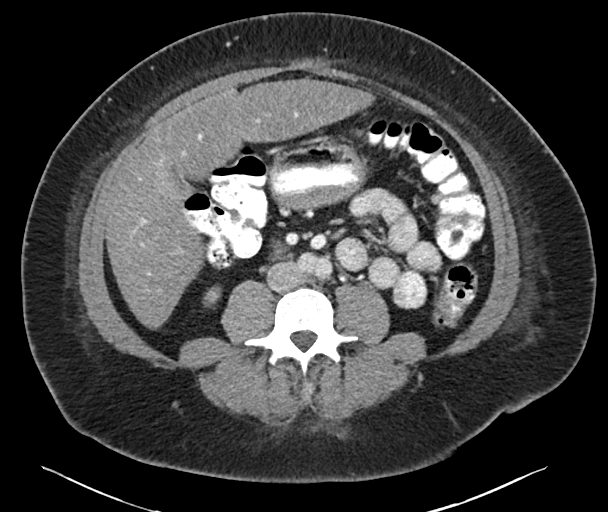
[im 68/99  soft-tissue]
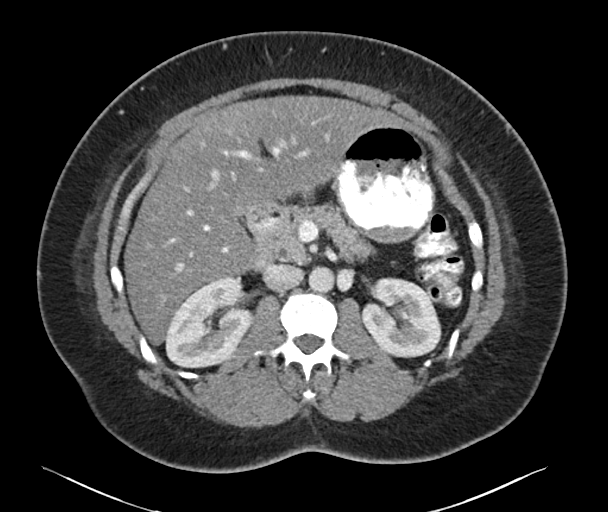
[im 73/99  soft-tissue]
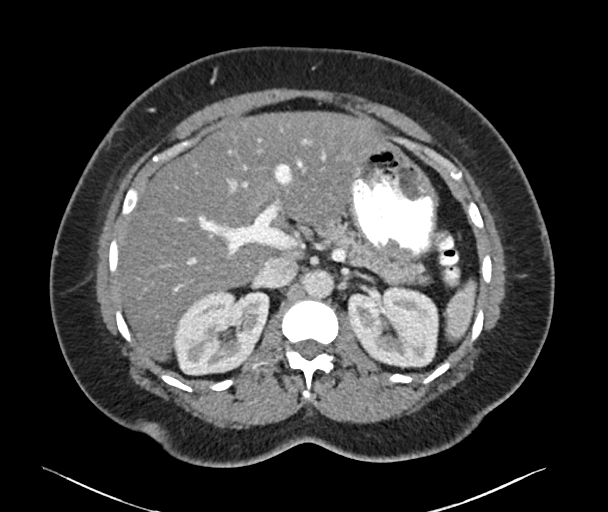
[im 73/99  bone]
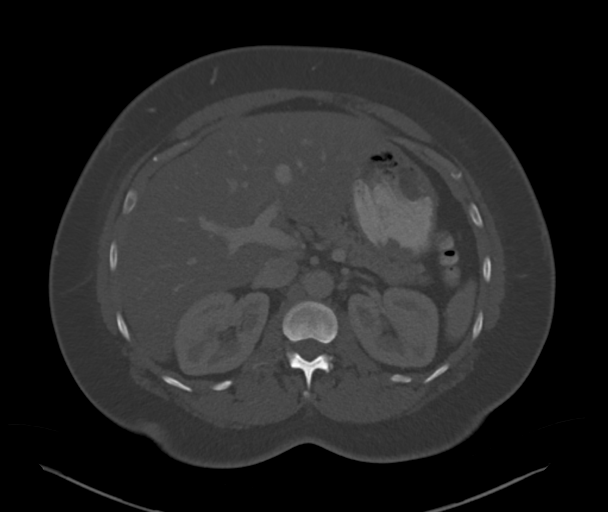
[im 83/99  soft-tissue]
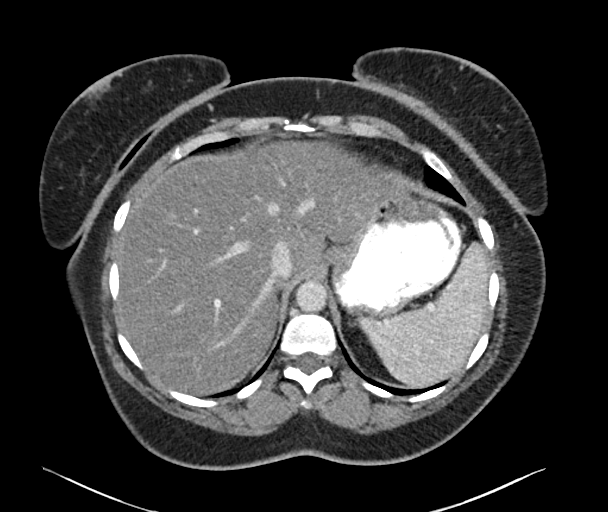
[im 93/99  soft-tissue]
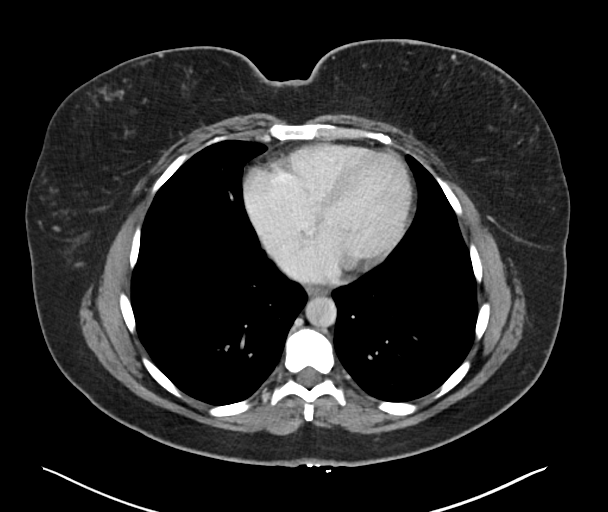

[Series 6: abd pelvis 2.00 br40 s3 cor · coronal · 0.84mm/px · 3 of 174 slices shown]
[im 58/174  soft-tissue]
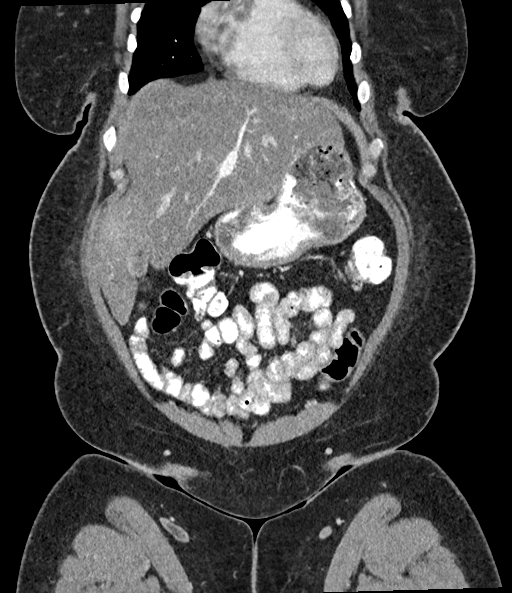
[im 77/174  soft-tissue]
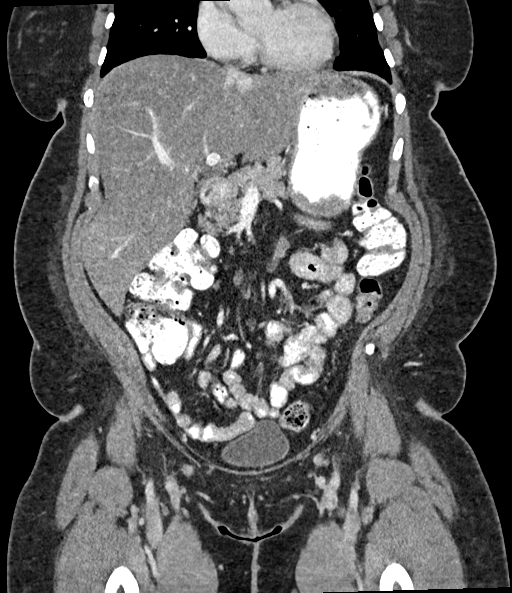
[im 97/174  soft-tissue]
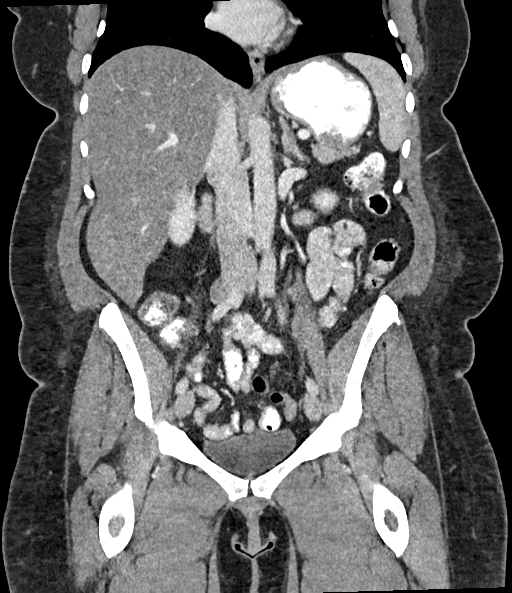

[14 of 46 positions shown; findings below may reference images not displayed]

FINDINGS: Lower chest: Lung bases are clear.

Hepatobiliary: No focal hepatic lesion. No biliary duct dilatation.
Gallbladder is normal. Common bile duct is normal.

Pancreas: Pancreas is normal. No ductal dilatation. No pancreatic
inflammation.

Spleen: Normal spleen

Adrenals/urinary tract: Adrenal glands and kidneys are normal. The
ureters and bladder normal.

Stomach/Bowel: Stomach, small-bowel and cecum are normal. The
appendix is not identified but there is no pericecal inflammation to
suggest appendicitis. The colon and rectosigmoid colon are normal.

Vascular/Lymphatic: Abdominal aorta is normal caliber. No periportal
or retroperitoneal adenopathy. No pelvic adenopathy.

Reproductive: Post hysterectomy. No adnexal abnormality.

Other: No inguinal hernia. No ventral hernia. Surgical scarring
along the midline abdominal wall subcutaneous tissue.

Musculoskeletal: No aggressive osseous lesion.
IMPRESSION: 1. No explanation for LEFT-sided abdominal pain.
2. No diverticulitis.
3. Post hysterectomy.

## 2020-02-29 MED FILL — OLMESARTAN-HCTZ 40-25 MG TA: 40-25 | 90 days supply | Qty: 90 | Fill #1

## 2020-03-01 ENCOUNTER — Other Ambulatory Visit (HOSPITAL_BASED_OUTPATIENT_CLINIC_OR_DEPARTMENT_OTHER): Payer: Self-pay | Admitting: Internal Medicine

## 2020-03-01 MED FILL — metFORMIN HCL ER 500 MG TB2: 500 | 90 days supply | Qty: 360 | Fill #0

## 2020-05-03 DIAGNOSIS — Z6835 Body mass index (BMI) 35.0-35.9, adult: Secondary | ICD-10-CM | POA: Diagnosis not present

## 2020-05-03 DIAGNOSIS — L732 Hidradenitis suppurativa: Secondary | ICD-10-CM | POA: Diagnosis not present

## 2020-05-03 DIAGNOSIS — D508 Other iron deficiency anemias: Secondary | ICD-10-CM | POA: Diagnosis not present

## 2020-05-03 DIAGNOSIS — I10 Essential (primary) hypertension: Secondary | ICD-10-CM | POA: Diagnosis not present

## 2020-05-03 DIAGNOSIS — L98491 Non-pressure chronic ulcer of skin of other sites limited to breakdown of skin: Secondary | ICD-10-CM | POA: Diagnosis not present

## 2020-05-03 DIAGNOSIS — J4520 Mild intermittent asthma, uncomplicated: Secondary | ICD-10-CM | POA: Diagnosis not present

## 2020-05-03 DIAGNOSIS — E119 Type 2 diabetes mellitus without complications: Secondary | ICD-10-CM | POA: Diagnosis not present

## 2020-05-04 ENCOUNTER — Other Ambulatory Visit (HOSPITAL_BASED_OUTPATIENT_CLINIC_OR_DEPARTMENT_OTHER): Payer: Self-pay | Admitting: Internal Medicine

## 2020-05-12 DIAGNOSIS — Z1231 Encounter for screening mammogram for malignant neoplasm of breast: Secondary | ICD-10-CM | POA: Diagnosis not present

## 2020-06-05 ENCOUNTER — Other Ambulatory Visit (HOSPITAL_BASED_OUTPATIENT_CLINIC_OR_DEPARTMENT_OTHER): Payer: Self-pay | Admitting: Internal Medicine

## 2020-06-05 MED FILL — GLIPIZIDE ER 2.5 MG TB24: 2.5 | 90 days supply | Qty: 90 | Fill #0

## 2020-06-05 MED FILL — metFORMIN HCL ER 500 MG TB2: 500 | 90 days supply | Qty: 360 | Fill #1

## 2020-06-05 MED FILL — OLMESARTAN-HCTZ 40-25 MG TA: 40-25 | 90 days supply | Qty: 90 | Fill #0

## 2020-09-13 DIAGNOSIS — Y92410 Unspecified street and highway as the place of occurrence of the external cause: Secondary | ICD-10-CM | POA: Diagnosis not present

## 2020-09-13 DIAGNOSIS — M545 Low back pain, unspecified: Secondary | ICD-10-CM | POA: Diagnosis not present

## 2020-09-13 DIAGNOSIS — S39012A Strain of muscle, fascia and tendon of lower back, initial encounter: Secondary | ICD-10-CM | POA: Diagnosis not present

## 2020-09-13 DIAGNOSIS — Y999 Unspecified external cause status: Secondary | ICD-10-CM | POA: Diagnosis not present

## 2020-09-14 ENCOUNTER — Other Ambulatory Visit (HOSPITAL_BASED_OUTPATIENT_CLINIC_OR_DEPARTMENT_OTHER): Payer: Self-pay

## 2020-09-14 MED FILL — Metformin HCl Tab ER 24HR 500 MG: ORAL | 90 days supply | Qty: 360 | Fill #0 | Status: AC

## 2020-09-14 MED FILL — Olmesartan Medoxomil-Hydrochlorothiazide Tab 40-25 MG: ORAL | 90 days supply | Qty: 90 | Fill #0 | Status: AC

## 2020-09-14 MED FILL — Glipizide Tab ER 24HR 2.5 MG: ORAL | 90 days supply | Qty: 90 | Fill #0 | Status: AC

## 2020-09-15 ENCOUNTER — Other Ambulatory Visit (HOSPITAL_BASED_OUTPATIENT_CLINIC_OR_DEPARTMENT_OTHER): Payer: Self-pay

## 2020-09-15 DIAGNOSIS — M545 Low back pain, unspecified: Secondary | ICD-10-CM | POA: Diagnosis not present

## 2020-12-17 ENCOUNTER — Encounter (HOSPITAL_BASED_OUTPATIENT_CLINIC_OR_DEPARTMENT_OTHER): Payer: Self-pay | Admitting: Emergency Medicine

## 2020-12-17 ENCOUNTER — Emergency Department (HOSPITAL_BASED_OUTPATIENT_CLINIC_OR_DEPARTMENT_OTHER)
Admission: EM | Admit: 2020-12-17 | Discharge: 2020-12-17 | Disposition: A | Discharge status: Home / Self Care | Payer: 59 | Attending: Emergency Medicine | Admitting: Emergency Medicine

## 2020-12-17 ENCOUNTER — Emergency Department (HOSPITAL_BASED_OUTPATIENT_CLINIC_OR_DEPARTMENT_OTHER): Payer: 59

## 2020-12-17 ENCOUNTER — Other Ambulatory Visit: Payer: Self-pay

## 2020-12-17 DIAGNOSIS — Z79899 Other long term (current) drug therapy: Secondary | ICD-10-CM | POA: Diagnosis not present

## 2020-12-17 DIAGNOSIS — J45909 Unspecified asthma, uncomplicated: Secondary | ICD-10-CM | POA: Diagnosis not present

## 2020-12-17 DIAGNOSIS — Z7951 Long term (current) use of inhaled steroids: Secondary | ICD-10-CM | POA: Diagnosis not present

## 2020-12-17 DIAGNOSIS — X501XXA Overexertion from prolonged static or awkward postures, initial encounter: Secondary | ICD-10-CM | POA: Diagnosis not present

## 2020-12-17 DIAGNOSIS — E119 Type 2 diabetes mellitus without complications: Secondary | ICD-10-CM | POA: Diagnosis not present

## 2020-12-17 DIAGNOSIS — M7731 Calcaneal spur, right foot: Secondary | ICD-10-CM | POA: Diagnosis not present

## 2020-12-17 DIAGNOSIS — S93601A Unspecified sprain of right foot, initial encounter: Secondary | ICD-10-CM | POA: Diagnosis not present

## 2020-12-17 DIAGNOSIS — Z7984 Long term (current) use of oral hypoglycemic drugs: Secondary | ICD-10-CM | POA: Insufficient documentation

## 2020-12-17 DIAGNOSIS — I10 Essential (primary) hypertension: Secondary | ICD-10-CM | POA: Insufficient documentation

## 2020-12-17 DIAGNOSIS — S99921A Unspecified injury of right foot, initial encounter: Secondary | ICD-10-CM | POA: Diagnosis not present

## 2020-12-17 NOTE — ED Notes (Signed)
Post Op Shoe applied, application teaching provided, crutch teaching also provided. Pt returned crutch demonstration as well. Opportunity for questions provided prior to DC to home

## 2020-12-17 NOTE — Discharge Instructions (Addendum)
You were seen in the emergency department for evaluation of right foot pain after twisting it.  You had x-rays of your right foot that did not show any obvious fracture or dislocation.  This is likely a sprain.  Weightbearing as tolerated.  Crutches for comfort.  Use Tylenol and ibuprofen for pain and ice to affected area.  Follow-up with your doctor.

## 2020-12-17 NOTE — ED Provider Notes (Signed)
[facility] EMERGENCY DEPARTMENT Provider Note   CSN: [id number] Arrival date & time: 12/17/20  [id number]     History Chief Complaint  Patient presents with   Ankle Pain    Theresa Briggs is a 45 y.o. female.  She said for evaluation of injury to her right foot.  She said she was walking in her flip-flops and stepped wrong down a step and twisted her right foot last night.  Complaining of pain with ambulation.  No other injuries or complaints.  The history is provided by the patient.  Foot Injury Location:  Foot Time since incident:  2 days Injury: yes   Mechanism of injury comment:  Twisted Foot location:  R foot Pain details:    Quality:  Sharp and shooting   Radiates to:  Does not radiate   Severity:  Moderate   Onset quality:  Sudden   Timing:  Constant   Progression:  Unchanged Chronicity:  New Foreign body present:  No foreign bodies Relieved by:  Nothing Worsened by:  Bearing weight Ineffective treatments:  Rest Associated symptoms: no back pain, no fever and no numbness       Past Medical History:  Diagnosis Date   Asthma    Diverticulosis    Hypertension    Type 2 diabetes mellitus ([place])    Uterine fibroid     There are no problems to display for this patient.   Past Surgical History:  Procedure Laterality Date   HERNIA REPAIR     HYSTEROTOMY     UTERINE FIBROID SURGERY       OB History   No obstetric history on file.     Family History  Problem Relation Age of Onset   Diabetes Mother    Hypertension Mother    Stroke Mother    Hypertension Father    Cancer Father    Diabetes Paternal Grandmother    Diabetes Paternal Grandfather     Social History   Tobacco Use   Smoking status: Never   Smokeless tobacco: Never  Vaping Use   Vaping Use: Never used    Home Medications Prior to Admission medications   Medication Sig Start Date End Date Taking? Authorizing Provider  albuterol (VENTOLIN HFA) 108 (90 Base) MCG/ACT inhaler  Inhale into the lungs. 05/11/15   [provider]  dicyclomine (BENTYL) 10 MG capsule Take 2 capsules (20 mg total) by mouth every 6 (six) hours as needed for spasms (every 6 hours as  needed for abdominal pain). 09/15/18   Cirigliano, Vito V, DO  glipiZIDE (GLUCOTROL XL) 2.5 MG 24 hr tablet TAKE 1 TABLET BY MOUTH ONCE DAILY 05/04/20 05/04/21  Loraine Leriche., MD  metFORMIN (GLUCOPHAGE-XR) 500 MG 24 hr tablet Take by mouth. 12/20/16   [provider]  metFORMIN (GLUCOPHAGE-XR) 500 MG 24 hr tablet TAKE 2 TABLETS BY MOUTH TWICE A DAY 03/01/20 03/01/21  Loraine Leriche., MD  olmesartan-hydrochlorothiazide (BENICAR HCT) 40-25 MG tablet Take by mouth. 01/13/19   [provider]  olmesartan-hydrochlorothiazide (BENICAR HCT) 40-25 MG tablet TAKE 1 TABLET BY MOUTH DAILY. 06/05/20 06/05/21  Loraine Leriche., MD  olmesartan-hydrochlorothiazide (BENICAR HCT) 40-25 MG tablet TAKE 1 TABLET BY MOUTH DAILY. 11/24/19 11/23/20  Loraine Leriche., MD    Allergies    Patient has no known allergies.  Review of Systems   Review of Systems  Constitutional:  Negative for fever.  Musculoskeletal:  Negative for back pain.  Skin:  Negative for wound.  Physical Exam Updated Vital Signs BP 113/79 (BP Location: Right Arm)   Pulse 97   Temp 98.6 F (37 C) (Oral)   Resp 18   Ht '5\' 5"'$  (1.651 m)   Wt 99.8 kg   SpO2 100%   BMI 36.61 kg/m   Physical Exam Constitutional:      Appearance: Normal appearance. She is well-developed.  HENT:     Head: Normocephalic and atraumatic.  Eyes:     Conjunctiva/sclera: Conjunctivae normal.  Musculoskeletal:        General: Tenderness present. No deformity. Normal range of motion.     Cervical back: Neck supple.     Comments: Right hip knee and ankle nontender.  Tenderness right fifth metatarsal and across midfoot. No distal tenderness. No swelling or overlying erythema. Distal sensory and motor intact. Distal pulses intact   Skin:     General: Skin is warm and dry.  Neurological:     General: No focal deficit present.     Mental Status: She is alert.     GCS: GCS eye subscore is 4. GCS verbal subscore is 5. GCS motor subscore is 6.    ED Results / Procedures / Treatments   Labs (all labs ordered are listed, but only abnormal results are displayed) Labs Reviewed - No data to display  EKG None  Radiology DG Foot Complete Right  Result Date: 12/17/2020 CLINICAL DATA:  Right foot twisting injury last night with anterolateral pain EXAM: RIGHT FOOT COMPLETE - 3+ VIEW COMPARISON:  None. FINDINGS: No fracture or dislocation. Lisfranc joint appears intact. No suspicious focal osseous lesions. Tiny plantar and Achilles right calcaneal spurs. No radiopaque foreign bodies. No significant arthropathy. IMPRESSION: No fracture or dislocation. Tiny plantar and Achilles right calcaneal spurs. Electronically Signed   By: Ilona Sorrel M.D.   On: 12/17/2020 09:23    Procedures Procedures   Medications Ordered in ED Medications - No data to display  ED Course  I have reviewed the triage vital signs and the nursing notes.  Pertinent labs & imaging results that were available during my care of the patient were reviewed by me and considered in my medical decision making (see chart for details).  Clinical Course as of 12/18/20 [id number]  Sun Dec 17, 2020  0922 Right foot x-ray ordered and interpreted by me as no acute fractures.  Awaiting radiology reading. [MB]  [phone] Reviewed results with patient.  She would like some crutches. [MB]    Clinical Course User Index [MB] Hayden Rasmussen, MD   MDM Rules/Calculators/A&P                           45 year old female here with right foot pain after twisting it last night.  Ankle nontender.  X-rays of right foot ordered and interpreted by me as no acute fractures.  Will place in postop shoe and crutches.  Outpatient follow-up recommended.  Return instructions discussed Final Clinical  Impression(s) / ED Diagnoses Final diagnoses:  Sprain of right foot, initial encounter    Rx / DC Orders ED Discharge Orders     None        Hayden Rasmussen, MD 12/18/20 [phone]

## 2020-12-17 NOTE — ED Notes (Signed)
ED Provider at bedside. 

## 2020-12-17 NOTE — ED Triage Notes (Signed)
Pt reports missed a step when walking in home and reports right foot folded. Pt reports lateral pain of right foot, mild swelling noted. Pt able to bear weight but with increased pain. No obvious deformity noted.

## 2020-12-19 ENCOUNTER — Other Ambulatory Visit (HOSPITAL_BASED_OUTPATIENT_CLINIC_OR_DEPARTMENT_OTHER): Payer: Self-pay

## 2020-12-19 MED ORDER — OLMESARTAN MEDOXOMIL-HCTZ 40-25 MG PO TABS
1.0000 | ORAL_TABLET | Freq: Every day | ORAL | 0 refills | Status: DC
  Filled 2020-12-19 – 2021-01-02 (×2): qty 90, 90d supply, fill #0

## 2020-12-19 MED ORDER — GLIPIZIDE ER 2.5 MG PO TB24
2.5000 mg | ORAL_TABLET | Freq: Every day | ORAL | 0 refills | Status: DC
  Filled 2020-12-19 – 2021-01-02 (×2): qty 90, 90d supply, fill #0

## 2020-12-19 MED ORDER — METFORMIN HCL ER 500 MG PO TB24
ORAL_TABLET | ORAL | 0 refills | Status: DC
  Filled 2020-12-19 – 2021-01-02 (×2): qty 360, 90d supply, fill #0

## 2020-12-20 ENCOUNTER — Other Ambulatory Visit (HOSPITAL_BASED_OUTPATIENT_CLINIC_OR_DEPARTMENT_OTHER): Payer: Self-pay

## 2020-12-26 ENCOUNTER — Other Ambulatory Visit (HOSPITAL_BASED_OUTPATIENT_CLINIC_OR_DEPARTMENT_OTHER): Payer: Self-pay

## 2020-12-28 ENCOUNTER — Other Ambulatory Visit (HOSPITAL_BASED_OUTPATIENT_CLINIC_OR_DEPARTMENT_OTHER): Payer: Self-pay

## 2021-01-02 ENCOUNTER — Other Ambulatory Visit (HOSPITAL_BASED_OUTPATIENT_CLINIC_OR_DEPARTMENT_OTHER): Payer: Self-pay

## 2021-03-16 ENCOUNTER — Other Ambulatory Visit (HOSPITAL_BASED_OUTPATIENT_CLINIC_OR_DEPARTMENT_OTHER): Payer: Self-pay

## 2021-03-26 DIAGNOSIS — L03221 Cellulitis of neck: Secondary | ICD-10-CM | POA: Diagnosis not present

## 2021-04-02 DIAGNOSIS — H5213 Myopia, bilateral: Secondary | ICD-10-CM | POA: Diagnosis not present

## 2021-04-02 DIAGNOSIS — H04123 Dry eye syndrome of bilateral lacrimal glands: Secondary | ICD-10-CM | POA: Diagnosis not present

## 2021-04-02 DIAGNOSIS — E119 Type 2 diabetes mellitus without complications: Secondary | ICD-10-CM | POA: Diagnosis not present

## 2021-04-20 ENCOUNTER — Other Ambulatory Visit (HOSPITAL_BASED_OUTPATIENT_CLINIC_OR_DEPARTMENT_OTHER): Payer: Self-pay

## 2021-04-20 MED ORDER — OLMESARTAN MEDOXOMIL-HCTZ 40-25 MG PO TABS
1.0000 | ORAL_TABLET | Freq: Every day | ORAL | 0 refills | Status: DC
  Filled 2021-04-20: qty 90, 90d supply, fill #0

## 2021-04-20 MED ORDER — METFORMIN HCL ER 500 MG PO TB24
1000.0000 mg | ORAL_TABLET | Freq: Two times a day (BID) | ORAL | 0 refills | Status: DC
  Filled 2021-04-20: qty 360, 90d supply, fill #0

## 2021-04-20 MED FILL — Glipizide Tab ER 24HR 2.5 MG: ORAL | 90 days supply | Qty: 90 | Fill #1 | Status: AC

## 2021-04-23 ENCOUNTER — Other Ambulatory Visit (HOSPITAL_BASED_OUTPATIENT_CLINIC_OR_DEPARTMENT_OTHER): Payer: Self-pay

## 2021-07-26 ENCOUNTER — Ambulatory Visit (INDEPENDENT_AMBULATORY_CARE_PROVIDER_SITE_OTHER): Payer: 59 | Admitting: Family

## 2021-07-26 ENCOUNTER — Ambulatory Visit: Payer: 59 | Admitting: Family

## 2021-07-26 ENCOUNTER — Encounter: Payer: Self-pay | Admitting: Family

## 2021-07-26 VITALS — BP 110/70 | HR 110 | Temp 98.7°F | Ht 65.0 in | Wt 219.2 lb

## 2021-07-26 DIAGNOSIS — I10 Essential (primary) hypertension: Secondary | ICD-10-CM

## 2021-07-26 DIAGNOSIS — E119 Type 2 diabetes mellitus without complications: Secondary | ICD-10-CM | POA: Diagnosis not present

## 2021-07-26 DIAGNOSIS — E1159 Type 2 diabetes mellitus with other circulatory complications: Secondary | ICD-10-CM | POA: Diagnosis not present

## 2021-07-26 DIAGNOSIS — Z1231 Encounter for screening mammogram for malignant neoplasm of breast: Secondary | ICD-10-CM

## 2021-07-26 LAB — COMPREHENSIVE METABOLIC PANEL
ALT: 39 U/L — ABNORMAL HIGH (ref 0–35)
AST: 30 U/L (ref 0–37)
Albumin: 4.2 g/dL (ref 3.5–5.2)
Alkaline Phosphatase: 77 U/L (ref 39–117)
BUN: 10 mg/dL (ref 6–23)
CO2: 31 mEq/L (ref 19–32)
Calcium: 10.8 mg/dL — ABNORMAL HIGH (ref 8.4–10.5)
Chloride: 96 mEq/L (ref 96–112)
Creatinine, Ser: 0.82 mg/dL (ref 0.40–1.20)
GFR: 86.52 mL/min (ref >60.00)
Glucose, Bld: 227 mg/dL — ABNORMAL HIGH (ref 70–99)
Potassium: 4.3 mEq/L (ref 3.5–5.1)
Sodium: 136 mEq/L (ref 135–145)
Total Bilirubin: 0.6 mg/dL (ref 0.2–1.2)
Total Protein: 7.8 g/dL (ref 6.0–8.3)

## 2021-07-26 LAB — CBC WITH DIFFERENTIAL/PLATELET
Basophils Absolute: 0 10*3/uL (ref 0.0–0.1)
Basophils Relative: 0.4 % (ref 0.0–3.0)
Eosinophils Absolute: 0.1 10*3/uL (ref 0.0–0.7)
Eosinophils Relative: 0.9 % (ref 0.0–5.0)
HCT: 38.8 % (ref 36.0–46.0)
Hemoglobin: 12.4 g/dL (ref 12.0–15.0)
Lymphocytes Relative: 40.6 % (ref 12.0–46.0)
Lymphs Abs: 3.1 10*3/uL (ref 0.7–4.0)
MCHC: 31.9 g/dL (ref 30.0–36.0)
MCV: 78.9 fl (ref 78.0–100.0)
Monocytes Absolute: 0.6 10*3/uL (ref 0.1–1.0)
Monocytes Relative: 7.4 % (ref 3.0–12.0)
Neutro Abs: 3.9 10*3/uL (ref 1.4–7.7)
Neutrophils Relative %: 50.7 % (ref 43.0–77.0)
Platelets: 332 10*3/uL (ref 150.0–400.0)
RBC: 4.92 Mil/uL (ref 3.87–5.11)
RDW: 16.2 % — ABNORMAL HIGH (ref 11.5–15.5)
WBC: 7.6 10*3/uL (ref 4.0–10.5)

## 2021-07-26 LAB — LIPID PANEL
Cholesterol: 142 mg/dL (ref 0–200)
HDL: 33.5 mg/dL — ABNORMAL LOW (ref >39.00)
LDL Cholesterol: 70 mg/dL (ref 0–99)
NonHDL: 108.91
Total CHOL/HDL Ratio: 4
Triglycerides: 196 mg/dL — ABNORMAL HIGH (ref 0.0–149.0)
VLDL: 39.2 mg/dL (ref 0.0–40.0)

## 2021-07-26 LAB — HEMOGLOBIN A1C: Hgb A1c MFr Bld: 11 % — ABNORMAL HIGH (ref 4.6–6.5)

## 2021-07-26 NOTE — Progress Notes (Signed)
?Theresa Briggs is a 46 y.o. female with the following history as recorded in EpicCare:  ?There are no problems to display for this patient. ?  ?Current Outpatient Medications  ?Medication Sig Dispense Refill  ? albuterol (VENTOLIN HFA) 108 (90 Base) MCG/ACT inhaler Inhale into the lungs.    ? glipiZIDE (GLUCOTROL XL) 2.5 MG 24 hr tablet Take 1 tablet (2.5 mg total) by mouth daily. 90 tablet 0  ? lidocaine (LIDODERM) 5 % Place 1 patch onto the skin daily. Remove & Discard patch within 12 hours or as directed by MD    ? metFORMIN (GLUCOPHAGE-XR) 500 MG 24 hr tablet Take 2 tablets (1,000 mg total) by mouth 2 (two) times daily. 360 tablet 0  ? olmesartan-hydrochlorothiazide (BENICAR HCT) 40-25 MG tablet Take 1 tablet by mouth daily. 90 tablet 0  ? ?No current facility-administered medications for this visit.  ?  ?Allergies: Patient has no known allergies.  ?Past Medical History:  ?Diagnosis Date  ? Asthma   ? Diverticulosis   ? Hypertension   ? Type 2 diabetes mellitus ([place])   ? Uterine fibroid   ?  ?Past Surgical History:  ?Procedure Laterality Date  ? HERNIA REPAIR    ? HYSTEROTOMY    ? UTERINE FIBROID SURGERY    ?  ?Family History  ?Problem Relation Age of Onset  ? Diabetes Mother   ? Hypertension Mother   ? Stroke Mother   ? Hypertension Father   ? Cancer Father   ? Diabetes Paternal Grandmother   ? Diabetes Paternal Grandfather   ?  ?Social History  ? ?Tobacco Use  ? Smoking status: Never  ? Smokeless tobacco: Never  ?Substance Use Topics  ? Alcohol use: Not on file  ?  ?Subjective:  ?Presents today as a new patient; history of Type 2 Diabetes/ hypertension; ?Diagnosed with Type 2 diabetes in 2016;  ?Stable on Benicar HCT;  ? ?S/p total hysterectomy- secondary to fibroids;  ? ? ?  ?Objective:  ?Vitals:  ? 07/26/21 0955  ?BP: 110/70  ?Pulse: (!) 110  ?Temp: 98.7 ?F (37.1 ?C)  ?TempSrc: Oral  ?SpO2: 99%  ?Weight: 219 lb 3.2 oz (99.4 kg)  ?Height: 5' 5" (1.651 m)  ?  ?General: Well developed, well nourished, in no  acute distress  ?Skin : Warm and dry.  ?Head: Normocephalic and atraumatic  ?Eyes: Sclera and conjunctiva clear; pupils round and reactive to light; extraocular movements intact  ?Ears: External normal; canals clear; tympanic membranes normal  ?Oropharynx: Pink, supple. No suspicious lesions  ?Neck: Supple without thyromegaly, adenopathy  ?Lungs: Respirations unlabored; clear to auscultation bilaterally without wheeze, rales, rhonchi  ?CVS exam: normal rate and regular rhythm.  ?Neurologic: Alert and oriented; speech intact; face symmetrical; moves all extremities well; CNII-XII intact without focal deficit  ? ?Assessment:  ?1. Type 2 diabetes mellitus without complication, without long-term current use of insulin ([place])   ?2. Visit for screening mammogram   ?3. Hypertension associated with diabetes ([place])   ?4. Primary hypertension   ?  ?Plan:  ?Update labs today; will adjust medications as needed based on labs; ?Order updated; ?Stable; continue same medication;  ?Information given regarding Cologuard- she will review and determine if she would like to proceed.  ? ?This visit occurred during the SARS-CoV-2 public health emergency.  Safety protocols were in place, including screening questions prior to the visit, additional usage of staff PPE, and extensive cleaning of exam room while observing appropriate contact time as indicated for disinfecting  solutions.  ? ? ?No follow-ups on file.  ?Orders Placed This Encounter  ?Procedures  ? MM Digital Screening  ?  Standing Status:   Future  ?  Standing Expiration Date:   07/27/2022  ?  Order Specific Question:   Reason for Exam (SYMPTOM  OR DIAGNOSIS REQUIRED)  ?  Answer:   screening mammogram  ?  Order Specific Question:   Is the patient pregnant?  ?  Answer:   No  ?  Order Specific Question:   Preferred imaging location?  ?  Answer:   [facility]  ? CBC with Differential/Platelet  ? Comp Met (CMET)  ? Lipid panel  ? Hemoglobin A1c  ?  ?Requested Prescriptions  ? ?  No prescriptions requested or ordered in this encounter  ?  ? ?

## 2021-07-27 ENCOUNTER — Telehealth: Payer: Self-pay | Admitting: Family

## 2021-07-27 NOTE — Telephone Encounter (Signed)
Opened in error

## 2021-07-30 ENCOUNTER — Telehealth: Payer: Self-pay

## 2021-07-30 ENCOUNTER — Other Ambulatory Visit (HOSPITAL_BASED_OUTPATIENT_CLINIC_OR_DEPARTMENT_OTHER): Payer: Self-pay

## 2021-07-30 ENCOUNTER — Other Ambulatory Visit: Payer: Self-pay | Admitting: Family

## 2021-07-30 ENCOUNTER — Telehealth (HOSPITAL_BASED_OUTPATIENT_CLINIC_OR_DEPARTMENT_OTHER): Payer: Self-pay

## 2021-07-30 MED ORDER — TIRZEPATIDE 2.5 MG/0.5ML ~~LOC~~ SOAJ
2.5000 mg | SUBCUTANEOUS | 1 refills | Status: DC
  Filled 2021-07-30: qty 2, 28d supply, fill #0

## 2021-07-30 NOTE — Telephone Encounter (Signed)
PA denied. Not covered by plan.  ? ?PA Case: [id number], Status: Closed, Closed Reason Code: FM Product not covered by this plan. Prior Authorization not available., Closed Rationale: [employer] Prior Authorization team is unable to review this product for a coverage determination as the requested medication is not on the patient's formulary. Please reach out to [employer] directly for this request. Thank you in advance.. Questions? Contact [phone]. ?

## 2021-07-30 NOTE — Telephone Encounter (Signed)
I have spoken to the pt and informed her that it most likely wasn't covered and informed her to go to the web-site to see if she will be eligable for the 25$ coupon. She stated that she will check it out after work today.  ?

## 2021-07-30 NOTE — Telephone Encounter (Signed)
PA initiated via Covermymeds;KEY: [id number]. Awaiting determination.  ?

## 2021-08-03 ENCOUNTER — Other Ambulatory Visit (HOSPITAL_BASED_OUTPATIENT_CLINIC_OR_DEPARTMENT_OTHER): Payer: Self-pay

## 2021-08-03 ENCOUNTER — Other Ambulatory Visit: Payer: Self-pay | Admitting: Family

## 2021-08-03 MED ORDER — OLMESARTAN MEDOXOMIL-HCTZ 40-25 MG PO TABS
1.0000 | ORAL_TABLET | Freq: Every day | ORAL | 0 refills | Status: DC
  Filled 2021-08-03: qty 90, 90d supply, fill #0

## 2021-08-03 MED ORDER — METFORMIN HCL ER 500 MG PO TB24
1000.0000 mg | ORAL_TABLET | Freq: Two times a day (BID) | ORAL | 0 refills | Status: DC
  Filled 2021-08-03: qty 360, 90d supply, fill #0

## 2021-08-06 ENCOUNTER — Other Ambulatory Visit (HOSPITAL_BASED_OUTPATIENT_CLINIC_OR_DEPARTMENT_OTHER): Payer: Self-pay

## 2021-08-07 ENCOUNTER — Other Ambulatory Visit (HOSPITAL_BASED_OUTPATIENT_CLINIC_OR_DEPARTMENT_OTHER): Payer: Self-pay

## 2021-08-21 ENCOUNTER — Ambulatory Visit: Payer: 59 | Admitting: Family

## 2021-09-19 ENCOUNTER — Inpatient Hospital Stay (HOSPITAL_BASED_OUTPATIENT_CLINIC_OR_DEPARTMENT_OTHER): Admission: RE | Admit: 2021-09-19 | Payer: 59 | Source: Ambulatory Visit

## 2021-10-23 ENCOUNTER — Encounter (HOSPITAL_BASED_OUTPATIENT_CLINIC_OR_DEPARTMENT_OTHER): Payer: Self-pay

## 2021-10-23 ENCOUNTER — Ambulatory Visit (HOSPITAL_BASED_OUTPATIENT_CLINIC_OR_DEPARTMENT_OTHER)
Admission: RE | Admit: 2021-10-23 | Discharge: 2021-10-23 | Disposition: A | Discharge status: Home / Self Care | Payer: 59 | Source: Ambulatory Visit | Attending: Family | Admitting: Family

## 2021-10-23 DIAGNOSIS — Z1231 Encounter for screening mammogram for malignant neoplasm of breast: Secondary | ICD-10-CM | POA: Insufficient documentation

## 2021-10-25 ENCOUNTER — Telehealth: Payer: Self-pay | Admitting: *Deleted

## 2021-10-25 ENCOUNTER — Other Ambulatory Visit: Payer: Self-pay | Admitting: Family

## 2021-10-25 DIAGNOSIS — R928 Other abnormal and inconclusive findings on diagnostic imaging of breast: Secondary | ICD-10-CM

## 2021-10-25 NOTE — Telephone Encounter (Signed)
Spoke with patient and she stated that her insurance does not cover here and she would like her order sent over to [facility].  Orders have been faxed over to [facility].

## 2021-10-25 NOTE — Telephone Encounter (Signed)
-----   Message from Marrian Salvage, [place] sent at 10/24/2021  2:26 PM EDT ----- She needs a follow up on her Type 2 Diabetes. She was prescribed Mounjaro but it doesn't look like she ever started it. Her Hgba1c was very high- will need to discuss options to get her diabetes under control.  ----- Message ----- From: Interface, Rad Results In Sent: 10/24/2021  12:00 PM EDT To: Marrian Salvage, FNP

## 2021-10-25 NOTE — Telephone Encounter (Signed)
Spoke with patient and she has been taking the metformin. Patient scheduled for follow up 11/02/21.

## 2021-10-26 NOTE — Telephone Encounter (Signed)
Left detailed message on machine that referrals  have been sent to [facility]

## 2021-10-26 NOTE — Telephone Encounter (Signed)
Can you send the imaging orders to whomever takes [employer]? The patient asked  to change orders to [facility] because they take her ins and now apparently they do not.  I will be happy to reput in the orders if need be.  She had her initial mammogram here but they did not take her insurance.

## 2021-10-26 NOTE — Telephone Encounter (Signed)
Pt stated [facility] contacted her and advised her that they do not accept [employer] ins. Pt needs referral for MRI/CT sent elsewhere

## 2021-11-02 ENCOUNTER — Ambulatory Visit: Payer: 59 | Admitting: Family

## 2021-11-09 ENCOUNTER — Other Ambulatory Visit (HOSPITAL_BASED_OUTPATIENT_CLINIC_OR_DEPARTMENT_OTHER): Payer: Self-pay

## 2021-11-09 ENCOUNTER — Encounter: Payer: Self-pay | Admitting: Family

## 2021-11-09 ENCOUNTER — Ambulatory Visit (INDEPENDENT_AMBULATORY_CARE_PROVIDER_SITE_OTHER): Payer: 59 | Admitting: Family

## 2021-11-09 VITALS — BP 118/78 | HR 78 | Resp 20 | Ht 65.0 in | Wt 208.0 lb

## 2021-11-09 DIAGNOSIS — E1169 Type 2 diabetes mellitus with other specified complication: Secondary | ICD-10-CM | POA: Diagnosis not present

## 2021-11-09 DIAGNOSIS — E119 Type 2 diabetes mellitus without complications: Secondary | ICD-10-CM

## 2021-11-09 DIAGNOSIS — I10 Essential (primary) hypertension: Secondary | ICD-10-CM

## 2021-11-09 DIAGNOSIS — E785 Hyperlipidemia, unspecified: Secondary | ICD-10-CM | POA: Diagnosis not present

## 2021-11-09 MED ORDER — LANCETS 33G MISC
0 refills | Status: AC
  Filled 2021-11-09: qty 100, 25d supply, fill #0

## 2021-11-09 MED ORDER — ATORVASTATIN CALCIUM 20 MG PO TABS
20.0000 mg | ORAL_TABLET | Freq: Every day | ORAL | 3 refills | Status: AC
  Filled 2021-11-09: qty 90, 90d supply, fill #0
  Filled 2022-01-11 – 2022-01-14 (×2): qty 90, 90d supply, fill #1

## 2021-11-09 MED ORDER — GLUCOSE BLOOD VI STRP
ORAL_STRIP | 0 refills | Status: AC
  Filled 2021-11-09: qty 100, 25d supply, fill #0

## 2021-11-09 MED ORDER — METFORMIN HCL ER 500 MG PO TB24: ORAL_TABLET | ORAL | 0 refills | Status: DC

## 2021-11-09 MED ORDER — BLOOD GLUCOSE MONITOR KIT
PACK | 0 refills | Status: AC
  Filled 2021-11-09: qty 1, 1d supply, fill #0

## 2021-11-09 NOTE — Progress Notes (Signed)
Theresa Briggs is a 46 y.o. female with the following history as recorded in EpicCare:  There are no problems to display for this patient.   Current Outpatient Medications  Medication Sig Dispense Refill   albuterol (VENTOLIN HFA) 108 (90 Base) MCG/ACT inhaler Inhale into the lungs.     atorvastatin (LIPITOR) 20 MG tablet Take 1 tablet (20 mg total) by mouth daily. 90 tablet 3   blood glucose meter kit and supplies KIT Use up to 4 times daily as directed 1 each 0   lidocaine (LIDODERM) 5 % Place 1 patch onto the skin daily. Remove & Discard patch within 12 hours or as directed by MD     olmesartan-hydrochlorothiazide (BENICAR HCT) 40-25 MG tablet Take 1 tablet by mouth daily. 90 tablet 0   glucose blood test strip Use up to 4 times daily as directed 100 each 0   Lancets 33G MISC Use up to 4 times daily 100 each 0   metFORMIN (GLUCOPHAGE-XR) 500 MG 24 hr tablet Take 4 tablets daily 360 tablet 0   No current facility-administered medications for this visit.    Allergies: Nifedipine  Past Medical History:  Diagnosis Date   Asthma    Diverticulosis    Hypertension    Type 2 diabetes mellitus ([place])    Uterine fibroid     Past Surgical History:  Procedure Laterality Date   HERNIA REPAIR     HYSTEROTOMY     UTERINE FIBROID SURGERY      Family History  Problem Relation Age of Onset   Diabetes Mother    Hypertension Mother    Stroke Mother    Hypertension Father    Cancer Father    Diabetes Paternal Grandmother    Diabetes Paternal Grandfather     Social History   Tobacco Use   Smoking status: Never   Smokeless tobacco: Never  Substance Use Topics   Alcohol use: Not on file    Subjective:  Follow up on Type 2 Diabetes; has lost 11 pounds since OV in March 2023; is currently only taking Metformin 1000 mg bid; insurance would not cover GLP1; no acute concerns today;     Objective:  Vitals:   11/09/21 1409  BP: 118/78  Pulse: 78  Resp: 20  SpO2: 98%  Weight: 208 lb  (94.3 kg)  Height: _0  (1.651 m)    General: Well developed, well nourished, in no acute distress  Skin : Warm and dry.  Head: Normocephalic and atraumatic  Lungs: Respirations unlabored; clear to auscultation bilaterally without wheeze, rales, rhonchi  CVS exam: normal rate and regular rhythm.  Neurologic: Alert and oriented; speech intact; face symmetrical; moves all extremities well; CNII-XII intact without focal deficit   Assessment:  1. Type 2 diabetes mellitus without complication, without long-term current use of insulin ([place])   2. Hyperlipidemia associated with type 2 diabetes mellitus ([place])   3. Primary hypertension     Plan:  Congratulated patient on weight loss/ commitment to health; update labs today; Rx for glucometer updated; to consider medication changes needed after labs reviewed today; Rx for Lipitor 20 mg qd; risks/ benefits discussed;  Stable;   No follow-ups on file.  Orders Placed This Encounter  Procedures   Comp Met (CMET)   Hemoglobin A1c    Requested Prescriptions   Signed Prescriptions Disp Refills   blood glucose meter kit and supplies KIT 1 each 0    Sig: Use up to 4 times daily as directed  metFORMIN (GLUCOPHAGE-XR) 500 MG 24 hr tablet 360 tablet 0    Sig: Take 4 tablets daily   atorvastatin (LIPITOR) 20 MG tablet 90 tablet 3    Sig: Take 1 tablet (20 mg total) by mouth daily.

## 2021-11-10 LAB — COMPREHENSIVE METABOLIC PANEL
AG Ratio: 1.2 (calc) (ref 1.0–2.5)
ALT: 21 U/L (ref 6–29)
AST: 15 U/L (ref 10–35)
Albumin: 3.8 g/dL (ref 3.6–5.1)
Alkaline phosphatase (APISO): 86 U/L (ref 31–125)
BUN: 7 mg/dL (ref 7–25)
CO2: 28 mmol/L (ref 20–32)
Calcium: 9.7 mg/dL (ref 8.6–10.2)
Chloride: 98 mmol/L (ref 98–110)
Creat: 0.71 mg/dL (ref 0.50–0.99)
Globulin: 3.2 g/dL (calc) (ref 1.9–3.7)
Glucose, Bld: 384 mg/dL — ABNORMAL HIGH (ref 65–99)
Potassium: 4.5 mmol/L (ref 3.5–5.3)
Sodium: 135 mmol/L (ref 135–146)
Total Bilirubin: 0.4 mg/dL (ref 0.2–1.2)
Total Protein: 7 g/dL (ref 6.1–8.1)

## 2021-11-10 LAB — HEMOGLOBIN A1C: Hgb A1c MFr Bld: >14 % of total Hgb — ABNORMAL HIGH (ref <5.7)

## 2021-11-12 ENCOUNTER — Other Ambulatory Visit (HOSPITAL_BASED_OUTPATIENT_CLINIC_OR_DEPARTMENT_OTHER): Payer: Self-pay

## 2021-11-12 ENCOUNTER — Telehealth: Payer: Self-pay | Admitting: Family

## 2021-11-12 MED ORDER — SITAGLIPTIN PHOSPHATE 100 MG PO TABS
100.0000 mg | ORAL_TABLET | Freq: Every day | ORAL | 3 refills | Status: AC
  Filled 2021-11-12 – 2021-11-16 (×2): qty 30, 30d supply, fill #0
  Filled 2021-12-14: qty 30, 30d supply, fill #1
  Filled 2022-01-11 – 2022-01-14 (×2): qty 30, 30d supply, fill #2
  Filled 2022-02-14: qty 30, 30d supply, fill #3

## 2021-11-12 NOTE — Telephone Encounter (Signed)
I am covering for Mickel Baas who is out of the office. A1C shows that sugar is very uncontrolled (A1C is 14).  I would recommend the following:  1) continue metformin. 2) Continue efforts on healthy reduced sugar diet, exercise and weight loss. 3) Add [place] '100mg'$  once daily. 4) Check sugar twice daily after starting januvia x 1 week and send your updated readings to Mickel Baas for review.

## 2021-11-12 NOTE — Telephone Encounter (Signed)
Called but no answer and no voice mail set up 

## 2021-11-13 NOTE — Telephone Encounter (Signed)
I have called patient a few more times but no answer and no voice mail.  Mychart message sent with this information.

## 2021-11-14 ENCOUNTER — Other Ambulatory Visit (HOSPITAL_BASED_OUTPATIENT_CLINIC_OR_DEPARTMENT_OTHER): Payer: Self-pay

## 2021-11-16 ENCOUNTER — Other Ambulatory Visit: Payer: Self-pay | Admitting: Family

## 2021-11-16 ENCOUNTER — Other Ambulatory Visit (HOSPITAL_BASED_OUTPATIENT_CLINIC_OR_DEPARTMENT_OTHER): Payer: Self-pay

## 2021-11-16 MED ORDER — OLMESARTAN MEDOXOMIL-HCTZ 40-25 MG PO TABS
1.0000 | ORAL_TABLET | Freq: Every day | ORAL | 0 refills | Status: DC
  Filled 2021-11-16: qty 90, 90d supply, fill #0

## 2021-11-16 NOTE — Telephone Encounter (Signed)
Patient advised of results, new medication and provider's comments. She verbalized understanding

## 2021-11-20 ENCOUNTER — Other Ambulatory Visit (HOSPITAL_BASED_OUTPATIENT_CLINIC_OR_DEPARTMENT_OTHER): Payer: Self-pay

## 2021-11-23 ENCOUNTER — Other Ambulatory Visit (HOSPITAL_BASED_OUTPATIENT_CLINIC_OR_DEPARTMENT_OTHER): Payer: Self-pay

## 2021-11-23 ENCOUNTER — Other Ambulatory Visit: Payer: Self-pay

## 2021-11-23 ENCOUNTER — Other Ambulatory Visit: Payer: Self-pay | Admitting: Family

## 2021-11-23 MED ORDER — METFORMIN HCL ER 500 MG PO TB24
1000.0000 mg | ORAL_TABLET | Freq: Two times a day (BID) | ORAL | 0 refills | Status: DC
  Filled 2021-11-23: qty 360, 90d supply, fill #0

## 2021-12-11 ENCOUNTER — Other Ambulatory Visit (HOSPITAL_BASED_OUTPATIENT_CLINIC_OR_DEPARTMENT_OTHER): Payer: Self-pay

## 2021-12-14 ENCOUNTER — Other Ambulatory Visit (HOSPITAL_BASED_OUTPATIENT_CLINIC_OR_DEPARTMENT_OTHER): Payer: Self-pay

## 2022-01-01 ENCOUNTER — Telehealth: Payer: Self-pay | Admitting: Family

## 2022-01-01 NOTE — Telephone Encounter (Signed)
I have called pt and clarified with her that all of her medications are generic and usually the brand only meds are the newer ones and she is not taking any of them.

## 2022-01-01 NOTE — Telephone Encounter (Signed)
I have attempted to call pt with no answer so I left a message to call back.

## 2022-01-01 NOTE — Telephone Encounter (Signed)
Patient would like for someone to review her current medications and verify if there is a generic for them since her insurance is changing from [employer] to something else. Please give her a call to advise. Patient gives consent for detailed message to be left on her voicemail if she does not answer.

## 2022-01-08 ENCOUNTER — Telehealth: Payer: Self-pay | Admitting: Family

## 2022-01-08 ENCOUNTER — Other Ambulatory Visit: Payer: Self-pay | Admitting: Family

## 2022-01-08 DIAGNOSIS — E119 Type 2 diabetes mellitus without complications: Secondary | ICD-10-CM

## 2022-01-08 NOTE — Telephone Encounter (Signed)
Please reach out to her and let her know we need to follow up with her about a number of health issues. If you can not reach her by phone, please mail this as a letter.   1) Radiology has tried to reach her multiple times to get her scheduled for a diagnostic mammogram. She needs to call them at [phone] to get this scheduled.   2) Her diabetes is very poorly controlled- one of my colleagues had asked her to follow up with me at the end of June to let us know how she was doing. Based on the Hgba1c from June and the poor control, I do think she should follow up with endocrinologist. I will get a referral done for her.   3) Will her insurance be changing back to [facility] with the end of [employer]? That will help me get appropriate referrals set up for her.

## 2022-01-08 NOTE — Telephone Encounter (Signed)
lw 

## 2022-01-10 ENCOUNTER — Ambulatory Visit: Payer: 59 | Admitting: Family

## 2022-01-10 NOTE — Telephone Encounter (Signed)
Called and there was no answer so I left a message to call back. I will try to call pt one more time before sending the message below as a letter to be mailed out.

## 2022-01-10 NOTE — Telephone Encounter (Signed)
I have called the pt and there was no answer and I left a message to call back.

## 2022-01-10 NOTE — Telephone Encounter (Signed)
Pt has called back and she stated that she will no longer continue her care with [place] due to the fact that her insurance has changed to [facility] and it will not be covered under the new plan. I have informed /the pt to look at the [facility] and she stated understanding. She stated that her Friday plan ends on 01/24/22.   I stated understanding and gave the verbal to the provider as well.

## 2022-01-10 NOTE — Telephone Encounter (Signed)
Letter has been sent to pt via mychart and mailed to her home address.

## 2022-01-11 ENCOUNTER — Other Ambulatory Visit (HOSPITAL_BASED_OUTPATIENT_CLINIC_OR_DEPARTMENT_OTHER): Payer: Self-pay

## 2022-01-14 ENCOUNTER — Other Ambulatory Visit (HOSPITAL_BASED_OUTPATIENT_CLINIC_OR_DEPARTMENT_OTHER): Payer: Self-pay

## 2022-02-14 ENCOUNTER — Other Ambulatory Visit (HOSPITAL_BASED_OUTPATIENT_CLINIC_OR_DEPARTMENT_OTHER): Payer: Self-pay

## 2022-02-15 ENCOUNTER — Other Ambulatory Visit (HOSPITAL_BASED_OUTPATIENT_CLINIC_OR_DEPARTMENT_OTHER): Payer: Self-pay

## 2022-02-15 MED ORDER — OLMESARTAN MEDOXOMIL-HCTZ 40-25 MG PO TABS
1.0000 | ORAL_TABLET | Freq: Every day | ORAL | 0 refills | Status: AC
  Filled 2022-02-15: qty 90, 90d supply, fill #0

## 2022-02-15 MED ORDER — JARDIANCE 25 MG PO TABS
ORAL_TABLET | ORAL | 0 refills | Status: AC
  Filled 2022-02-15: qty 30, 30d supply, fill #0

## 2022-02-15 MED ORDER — JANUVIA 100 MG PO TABS
100.0000 mg | ORAL_TABLET | Freq: Every day | ORAL | 1 refills | Status: AC
  Filled 2022-02-15: qty 90, 90d supply, fill #0

## 2022-02-15 MED ORDER — METFORMIN HCL ER 500 MG PO TB24
ORAL_TABLET | ORAL | 0 refills | Status: AC
  Filled 2022-02-15: qty 360, 90d supply, fill #0

## 2022-02-15 MED ORDER — MOUNJARO 2.5 MG/0.5ML ~~LOC~~ SOAJ
SUBCUTANEOUS | 0 refills | Status: AC
  Filled 2022-02-15: qty 2, 28d supply, fill #0

## 2022-02-15 MED ORDER — MELOXICAM 7.5 MG PO TABS
7.5000 mg | ORAL_TABLET | Freq: Every day | ORAL | 1 refills | Status: DC
  Filled 2022-02-15: qty 30, 30d supply, fill #0

## 2022-02-15 MED ORDER — TIZANIDINE HCL 4 MG PO TABS
4.0000 mg | ORAL_TABLET | Freq: Four times a day (QID) | ORAL | 0 refills | Status: AC
  Filled 2022-02-15: qty 28, 7d supply, fill #0

## 2022-02-21 ENCOUNTER — Other Ambulatory Visit (HOSPITAL_BASED_OUTPATIENT_CLINIC_OR_DEPARTMENT_OTHER): Payer: Self-pay

## 2022-02-22 ENCOUNTER — Other Ambulatory Visit (HOSPITAL_BASED_OUTPATIENT_CLINIC_OR_DEPARTMENT_OTHER): Payer: Self-pay

## 2022-02-24 ENCOUNTER — Other Ambulatory Visit (HOSPITAL_BASED_OUTPATIENT_CLINIC_OR_DEPARTMENT_OTHER): Payer: Self-pay

## 2022-02-25 ENCOUNTER — Other Ambulatory Visit (HOSPITAL_BASED_OUTPATIENT_CLINIC_OR_DEPARTMENT_OTHER): Payer: Self-pay

## 2022-03-20 ENCOUNTER — Other Ambulatory Visit (HOSPITAL_BASED_OUTPATIENT_CLINIC_OR_DEPARTMENT_OTHER): Payer: Self-pay

## 2022-05-08 ENCOUNTER — Emergency Department (HOSPITAL_BASED_OUTPATIENT_CLINIC_OR_DEPARTMENT_OTHER): Payer: Commercial Managed Care - HMO

## 2022-05-08 ENCOUNTER — Encounter (HOSPITAL_BASED_OUTPATIENT_CLINIC_OR_DEPARTMENT_OTHER): Payer: Self-pay

## 2022-05-08 ENCOUNTER — Emergency Department (HOSPITAL_BASED_OUTPATIENT_CLINIC_OR_DEPARTMENT_OTHER)
Admission: EM | Admit: 2022-05-08 | Discharge: 2022-05-08 | Disposition: A | Discharge status: Home / Self Care | Payer: Commercial Managed Care - HMO | Attending: Emergency Medicine | Admitting: Emergency Medicine

## 2022-05-08 ENCOUNTER — Other Ambulatory Visit (HOSPITAL_BASED_OUTPATIENT_CLINIC_OR_DEPARTMENT_OTHER): Payer: Self-pay

## 2022-05-08 ENCOUNTER — Other Ambulatory Visit: Payer: Self-pay

## 2022-05-08 DIAGNOSIS — R0981 Nasal congestion: Secondary | ICD-10-CM | POA: Diagnosis present

## 2022-05-08 DIAGNOSIS — I10 Essential (primary) hypertension: Secondary | ICD-10-CM | POA: Diagnosis not present

## 2022-05-08 DIAGNOSIS — E119 Type 2 diabetes mellitus without complications: Secondary | ICD-10-CM | POA: Insufficient documentation

## 2022-05-08 DIAGNOSIS — J45909 Unspecified asthma, uncomplicated: Secondary | ICD-10-CM | POA: Diagnosis not present

## 2022-05-08 DIAGNOSIS — Z79899 Other long term (current) drug therapy: Secondary | ICD-10-CM | POA: Diagnosis not present

## 2022-05-08 DIAGNOSIS — Z7984 Long term (current) use of oral hypoglycemic drugs: Secondary | ICD-10-CM | POA: Diagnosis not present

## 2022-05-08 DIAGNOSIS — Z7951 Long term (current) use of inhaled steroids: Secondary | ICD-10-CM | POA: Insufficient documentation

## 2022-05-08 DIAGNOSIS — J069 Acute upper respiratory infection, unspecified: Secondary | ICD-10-CM

## 2022-05-08 DIAGNOSIS — J101 Influenza due to other identified influenza virus with other respiratory manifestations: Secondary | ICD-10-CM | POA: Insufficient documentation

## 2022-05-08 DIAGNOSIS — Z20822 Contact with and (suspected) exposure to covid-19: Secondary | ICD-10-CM | POA: Diagnosis not present

## 2022-05-08 LAB — RESP PANEL BY RT-PCR (RSV, FLU A&B, COVID)  RVPGX2
Influenza A by PCR: NEGATIVE
Influenza B by PCR: POSITIVE — AB
Resp Syncytial Virus by PCR: NEGATIVE
SARS Coronavirus 2 by RT PCR: NEGATIVE

## 2022-05-08 LAB — CBG MONITORING, ED: Glucose-Capillary: 194 mg/dL — ABNORMAL HIGH (ref 70–99)

## 2022-05-08 MED ORDER — PREDNISONE 50 MG PO TABS
50.0000 mg | ORAL_TABLET | Freq: Every day | ORAL | 0 refills | Status: AC
  Filled 2022-05-08: qty 5, 5d supply, fill #0

## 2022-05-08 MED ORDER — ALBUTEROL SULFATE (2.5 MG/3ML) 0.083% IN NEBU
5.0000 mg | INHALATION_SOLUTION | Freq: Once | RESPIRATORY_TRACT | Status: AC
  Administered 2022-05-08: 5 mg via RESPIRATORY_TRACT
  Filled 2022-05-08: qty 6

## 2022-05-08 MED ORDER — ALBUTEROL SULFATE HFA 108 (90 BASE) MCG/ACT IN AERS
1.0000 | INHALATION_SPRAY | Freq: Four times a day (QID) | RESPIRATORY_TRACT | 0 refills | Status: AC | PRN
  Filled 2022-05-08: qty 6.7, 25d supply, fill #0

## 2022-05-08 MED ORDER — ACETAMINOPHEN 325 MG PO TABS
650.0000 mg | ORAL_TABLET | Freq: Once | ORAL | Status: AC
  Administered 2022-05-08: 650 mg via ORAL
  Filled 2022-05-08: qty 2

## 2022-05-08 MED ORDER — GUAIFENESIN ER 600 MG PO TB12
600.0000 mg | ORAL_TABLET | Freq: Two times a day (BID) | ORAL | 0 refills | Status: AC
  Filled 2022-05-08: qty 14, 7d supply, fill #0

## 2022-05-08 MED ORDER — LORATADINE 10 MG PO TABS
10.0000 mg | ORAL_TABLET | Freq: Every day | ORAL | 0 refills | Status: AC
  Filled 2022-05-08: qty 100, 90d supply, fill #0

## 2022-05-08 MED ORDER — SODIUM CHLORIDE 0.9 % IV BOLUS
1000.0000 mL | Freq: Once | INTRAVENOUS | Status: AC
  Administered 2022-05-08: 1000 mL via INTRAVENOUS

## 2022-05-08 MED ORDER — BENZONATATE 100 MG PO CAPS
100.0000 mg | ORAL_CAPSULE | Freq: Three times a day (TID) | ORAL | 0 refills | Status: AC
  Filled 2022-05-08: qty 21, 7d supply, fill #0

## 2022-05-08 NOTE — Discharge Instructions (Addendum)
You were seen in the emergency department today for upper respiratory infection with cough.  As we discussed your Flu test is positive.  This is a viral illness that is very common at this time of year, and is normally treated with over-the-counter medications.  Symptoms can last for up to a week.  You can take ibuprofen or Tylenol for pain or fever, and I recommend alternating between both.    Make sure that you are drinking lots of fluids and getting plenty of rest.  You can take decongestants as long as you take them with lots of water.  You can use lozenges or chloraseptic spray as needed for sore throat.   Maximum doses of Tylenol and Ibuprofen: You may use up to 600 mg ibuprofen every 6 hours or 1000 mg of Tylenol every 6 hours.  This would be most effective.  Do not exceed 4 g (4000 mg) of Tylenol within 24 hours.  Do not exceed 3200 mg ibuprofen within 24 hours.  It has been a pleasure taking care of you today and I hope you begin to feel better soon.  Continue to monitor how you are doing, and return to the emergency department for new or worsening symptoms such as chest pain, difficulty breathing not related to coughing, fever despite medication, or persistent vomiting or diarrhea.

## 2022-05-08 NOTE — ED Notes (Signed)
Dc instructions and scripts reviewed with pt no questions or concerns a this time.

## 2022-05-08 NOTE — ED Provider Notes (Signed)
[facility] Provider Note   CSN: [id number] Arrival date & time: 05/08/22  1431     History  Chief Complaint  Patient presents with   Cough    Theresa Briggs is a 46 y.o. female with Hx of asthma and poorly controlled DMT2 presenting to the ED today due to 4 days of URI symptoms.  These include congestion, runny nose, chills, cough, and chest tightness.  Patient states URIs commonly trigger her asthma and results in chest tightness.  Denies chest pain, shortness of breath, or chest discomfort.  Family member recently tested positive for flu.  Started developing symptoms 2-3 days after family member.  Has been utilizing DayQuil, NyQuil, and Robitussin with some relief.  Also some relief with home albuterol.  Denies neck stiffness, headache, abdominal pain, vomiting, diarrhea, though with mild nausea.  Pt states this may be due to decreased food and fluid intake.  Diabetes controlled with metformin per pt, blood glucose around 300 on average.  No other complaints at this time.  The history is provided by the patient and medical records.  Cough    Home Medications Prior to Admission medications   Medication Sig Start Date End Date Taking? Authorizing Provider  benzonatate (TESSALON) 100 MG capsule Take 1 capsule (100 mg total) by mouth every 8 (eight) hours. 05/08/22  Yes Prince Rome, PA-C  guaiFENesin (MUCINEX) 600 MG 12 hr tablet Take 1 tablet (600 mg total) by mouth 2 (two) times daily for 7 days. 05/08/22 05/15/22 Yes Prince Rome, PA-C  loratadine (CLARITIN) 10 MG tablet Take 1 tablet (10 mg total) by mouth daily for 14 days. 05/08/22 05/22/22 Yes Prince Rome, PA-C  predniSONE (DELTASONE) 50 MG tablet Take 1 tablet (50 mg total) by mouth daily for 5 days. 05/08/22 05/13/22 Yes Prince Rome, PA-C  albuterol (VENTOLIN HFA) 108 (90 Base) MCG/ACT inhaler Inhale 1 puff into the lungs every 6 (six) hours as needed for wheezing or  shortness of breath. 39/03/00   Prince Rome, PA-C  atorvastatin (LIPITOR) 20 MG tablet Take 1 tablet (20 mg total) by mouth daily. 11/09/21   Marrian Salvage, FNP  blood glucose meter kit and supplies KIT Use up to 4 times daily as directed 11/09/21   Marrian Salvage, FNP  empagliflozin (JARDIANCE) 25 MG TABS tablet Take 1 tablet (25 mg total) by mouth daily for 30 days. 02/15/22     glucose blood test strip Use up to 4 times daily as directed 11/09/21   Marrian Salvage, FNP  Lancets 33G MISC Use up to 4 times daily 11/09/21   Marrian Salvage, FNP  lidocaine (LIDODERM) 5 % Place 1 patch onto the skin daily. Remove & Discard patch within 12 hours or as directed by MD    [provider]  meloxicam (MOBIC) 7.5 MG tablet Take 1 tablet (7.5 mg total) by mouth daily. 02/15/22     metFORMIN (GLUCOPHAGE-XR) 500 MG 24 hr tablet Take 2 tablets (1,000 mg total) by mouth 2 times daily. 02/15/22     olmesartan-hydrochlorothiazide (BENICAR HCT) 40-25 MG tablet Take 1 tablet by mouth daily. 02/15/22     sitaGLIPtin (JANUVIA) 100 MG tablet Take 1 tablet (100 mg total) by mouth daily. 11/12/21   Debbrah Alar, NP  sitaGLIPtin (JANUVIA) 100 MG tablet Take 1 tablet (100 mg total) by mouth daily. 02/15/22     tirzepatide (MOUNJARO) 2.5 MG/0.5ML Pen Inject 2.5 mg into the skin every 7  days for 30 days. 02/15/22     tiZANidine (ZANAFLEX) 4 MG tablet Take 1 tablet (4 mg total) by mouth every 6 (six) hours for 7 days 02/15/22         Allergies    Nifedipine    Review of Systems   Review of Systems  Respiratory:  Positive for cough.     Physical Exam Updated Vital Signs BP 118/79   Pulse (!) 116   Temp 99.3 F (37.4 C)   Resp 14   Ht _0  (1.651 m)   Wt 91.2 kg   LMP  (LMP Unknown)   SpO2 100%   BMI 33.45 kg/m  Physical Exam Vitals and nursing note reviewed.  Constitutional:      General: She is not in acute distress.    Appearance: She is well-developed. She  is not ill-appearing, toxic-appearing or diaphoretic.  HENT:     Head: Normocephalic and atraumatic.     Right Ear: Tympanic membrane, ear canal and external ear normal.     Left Ear: Tympanic membrane, ear canal and external ear normal.     Nose: Congestion and rhinorrhea present.     Mouth/Throat:     Comments: Airway patent.  Uvula midline and without swelling.  Erythematous posterior oropharynx.  Without tonsillar swelling, erythema, or exudate.  Mucus membranes moist, not pale Eyes:     Conjunctiva/sclera: Conjunctivae normal.  Neck:     Comments: Very supple on exam, no meningismus or torticollis.  No overt cervical lymphadenopathy. Cardiovascular:     Rate and Rhythm: Normal rate and regular rhythm.     Heart sounds: No murmur heard. Pulmonary:     Effort: Pulmonary effort is normal. No tachypnea, accessory muscle usage or respiratory distress.     Breath sounds: No stridor. Wheezing (Mild, global) present. No rhonchi or rales.     Comments: Able to communicate without difficulty, without increased respiratory effort.  Adequate air movement appreciated. Chest:     Chest wall: No tenderness.  Abdominal:     Palpations: Abdomen is soft.     Tenderness: There is no abdominal tenderness.  Musculoskeletal:        General: No swelling.     Cervical back: Neck supple. No rigidity.  Skin:    General: Skin is warm and dry.     Capillary Refill: Capillary refill takes less than 2 seconds.  Neurological:     Mental Status: She is alert.  Psychiatric:        Mood and Affect: Mood normal.     ED Results / Procedures / Treatments   Labs (all labs ordered are listed, but only abnormal results are displayed) Labs Reviewed  RESP PANEL BY RT-PCR (RSV, FLU A&B, COVID)  RVPGX2 - Abnormal; Notable for the following components:      Result Value   Influenza B by PCR POSITIVE (*)    All other components within normal limits  CBG MONITORING, ED - Abnormal; Notable for the following  components:   Glucose-Capillary 194 (*)    All other components within normal limits    EKG EKG Interpretation  Date/Time:  Wednesday May 08 2022 14:46:05 EST Ventricular Rate:  114 PR Interval:  131 QRS Duration: 84 QT Interval:  306 QTC Calculation: 422 R Axis:   -21 Text Interpretation: Sinus tachycardia Borderline left axis deviation Consider anterior infarct No old tracing to compare Confirmed by Aletta Edouard [id number]) on 05/08/2022 2:51:50 PM  Radiology DG Chest 2 View  Result Date: 05/08/2022 CLINICAL DATA:  Chest tightness, cough EXAM: CHEST - 2 VIEW COMPARISON:  Chest 06/27/2009 FINDINGS: The heart size and mediastinal contours are within normal limits. Both lungs are clear. The visualized skeletal structures are unremarkable. IMPRESSION: No active cardiopulmonary disease. Electronically Signed   By: Franchot Gallo M.D.   On: 05/08/2022 16:05    Procedures Procedures    Medications Ordered in ED Medications  albuterol (PROVENTIL) (2.5 MG/3ML) 0.083% nebulizer solution 5 mg (5 mg Nebulization Given 05/08/22 1559)  sodium chloride 0.9 % bolus 1,000 mL (0 mLs Intravenous Stopped 05/08/22 1716)  acetaminophen (TYLENOL) tablet 650 mg (650 mg Oral Given 05/08/22 1610)    ED Course/ Medical Decision Making/ A&P Clinical Course as of 05/08/22 1727  Wed May 08, 2022  1608 SpO2(!): 9 % Pt satting at 100% on evaluation. [AC]  1726 Glucose-Capillary(!): 194 [AC]    Clinical Course User Index [AC] Prince Rome, PA-C                           Medical Decision Making Amount and/or Complexity of Data Reviewed Labs:  Decision-making details documented in ED Course. Radiology: ordered.  Risk OTC drugs. Prescription drug management.   46 y.o. female presents to the ED for concern of Cough   This involves an extensive number of treatment options, and is a complaint that carries with it a high risk of complications and morbidity.  The emergent differential  diagnosis prior to evaluation includes, but is not limited to: Viral pharyngitis, bacterial pharyngitis, PTA, RPA, pneumonia, bronchitis  This is not an exhaustive differential.   Past Medical History / Co-morbidities / Social History: Hx of asthma, DMT2 poorly controlled, diverticulosis, HTN Social Determinants of Health include: Poorly controlled DMT2, counseling provided.  Additional History:  Obtained by chart review.  Notably recent telephone documentation from PCP office, recommending further evaluation by ED based on symptom report.  Lab Tests: I ordered, and personally interpreted labs.  The pertinent results include:   Influenza B positive COVID and RSV negative  Imaging Studies: I ordered imaging studies including CXR.   I independently visualized and interpreted imaging which showed no acute pulmonary pathology I agree with the radiologist interpretation. I personally ordered and interpreted EKG 12 lead findings, which showed: Sinus tachycardia without significant evidence of myocardial infarction or arrhythmia  ED Course: Pt overall well-appearing on exam.  Nontoxic, nonseptic appearing in NAD.  Afebrile.  Presenting with 4 days of URI symptoms.  Hx of asthma and DMT2.  Pt reports chest tightness commonly with URI, and has had some relief with her inhaler, Robitussin, and acetaminophen.  Recent sick contacts.  No meningismus or fevers. Mild nausea with poor appetite and fluid intake. Influenza B positive.  CXR without acute findings.  EKG and history does not suggest myocardial infarction or arrhythmia.  Minimal wheezing with good air movement appreciated.  Does not appear to be in respiratory distress.  Wells Criteria of 1.5 due to tachycardia, low risk.  Without chest pain or discomfort.  No shortness of breath.   Upon reevaluation, patient reports significant improvement after albuterol and IV fluid administration.  Patient states chest tightness has resolved, and still  without shortness of breath.  Though mildly tachycardic, believe this is contributed to by the use of albuterol and overall viral illness.  Presentation still less likely due to PE.  Clinical diagnosis of viral pharyngitis, and tested positive for influenza B.  Patient is  4 days out from symptom onset.  Discussed that antibiotics are not indicated for viral infections.  Recommend continued conservative management from home, including symptom management, staying hydrated, keeping up with adequate nutritional intake, and rest.  Short course of steroids, cough suppressant, and decongestant sent to pharmacy.  Strict return precautions discussed. Patient reports satisfaction with today's encounter.  Patient in NAD and in good condition at time of discharge.  Disposition: After consideration the patient's encounter today, I do not feel today's workup suggests an emergent condition requiring admission or immediate intervention beyond what has been performed at this time.  Safe for discharge; instructed to return immediately for worsening symptoms, change in symptoms or any other concerns.  I have reviewed the patients home medicines and have made adjustments as needed.  Discussed course of treatment with the patient, whom demonstrated understanding.  Patient in agreement and has no further questions.    I discussed this case with my attending physician Dr. Sherry Ruffing, who agreed with the proposed treatment course and cosigned this note.  Attending physician stated agreement with plan or made changes to plan which were implemented.    This chart was dictated using voice recognition software.  Despite best efforts to proofread, errors can occur which can change the documentation meaning.         Final Clinical Impression(s) / ED Diagnoses Final diagnoses:  Viral URI with cough  Influenza B    Rx / DC Orders ED Discharge Orders          Ordered    albuterol (VENTOLIN HFA) 108 (90 Base) MCG/ACT inhaler   Every 6 hours PRN        05/08/22 1725    loratadine (CLARITIN) 10 MG tablet  Daily        05/08/22 1725    guaiFENesin (MUCINEX) 600 MG 12 hr tablet  2 times daily        05/08/22 1725    predniSONE (DELTASONE) 50 MG tablet  Daily        05/08/22 1725    benzonatate (TESSALON) 100 MG capsule  Every 8 hours        05/08/22 1725              Prince Rome, PA-C 75/17/00 1749    Hayden Rasmussen, MD 05/12/22 1046

## 2022-05-08 NOTE — ED Triage Notes (Signed)
Pt c/o productive cough, chest tightness, HA, and chills x 2 days. Pt with + sick contacts with flu. Pt with hx of asthma.

## 2022-06-26 ENCOUNTER — Other Ambulatory Visit (HOSPITAL_BASED_OUTPATIENT_CLINIC_OR_DEPARTMENT_OTHER): Payer: Self-pay

## 2022-06-26 MED ORDER — DEXCOM G6 SENSOR MISC
11 refills | Status: DC
  Filled 2022-06-26: qty 3, 30d supply, fill #0

## 2022-06-26 MED ORDER — DEXCOM G6 RECEIVER DEVI
0 refills | Status: DC
  Filled 2022-06-26: qty 1, 90d supply, fill #0

## 2022-06-26 MED ORDER — GLIPIZIDE ER 10 MG PO TB24
10.0000 mg | ORAL_TABLET | Freq: Every day | ORAL | 3 refills | Status: AC
  Filled 2022-06-26: qty 90, 90d supply, fill #0

## 2022-06-26 MED ORDER — DEXCOM G6 TRANSMITTER MISC
3 refills | Status: DC
  Filled 2022-06-26: qty 1, 90d supply, fill #0

## 2022-06-26 MED ORDER — LEVEMIR FLEXPEN 100 UNIT/ML ~~LOC~~ SOPN
10.0000 [IU] | PEN_INJECTOR | Freq: Every evening | SUBCUTANEOUS | 0 refills | Status: DC
  Filled 2022-06-26: qty 3, 30d supply, fill #0

## 2022-06-26 MED ORDER — OLMESARTAN MEDOXOMIL-HCTZ 40-25 MG PO TABS
1.0000 | ORAL_TABLET | Freq: Every day | ORAL | 0 refills | Status: DC
  Filled 2022-06-26: qty 90, 90d supply, fill #0

## 2022-06-26 MED ORDER — METFORMIN HCL ER 500 MG PO TB24
ORAL_TABLET | ORAL | 0 refills | Status: DC
  Filled 2022-06-26: qty 360, 90d supply, fill #0

## 2022-07-10 ENCOUNTER — Other Ambulatory Visit (HOSPITAL_BASED_OUTPATIENT_CLINIC_OR_DEPARTMENT_OTHER): Payer: Self-pay

## 2022-07-10 ENCOUNTER — Encounter (HOSPITAL_BASED_OUTPATIENT_CLINIC_OR_DEPARTMENT_OTHER): Payer: Self-pay

## 2022-07-12 ENCOUNTER — Other Ambulatory Visit (HOSPITAL_BASED_OUTPATIENT_CLINIC_OR_DEPARTMENT_OTHER): Payer: Self-pay

## 2022-07-12 MED ORDER — CONTOUR NEXT TEST VI STRP
ORAL_STRIP | 3 refills | Status: AC
  Filled 2022-07-12: qty 100, 90d supply, fill #0

## 2022-07-18 ENCOUNTER — Other Ambulatory Visit (HOSPITAL_BASED_OUTPATIENT_CLINIC_OR_DEPARTMENT_OTHER): Payer: Self-pay

## 2022-07-26 ENCOUNTER — Other Ambulatory Visit (HOSPITAL_BASED_OUTPATIENT_CLINIC_OR_DEPARTMENT_OTHER): Payer: Self-pay

## 2022-07-26 ENCOUNTER — Other Ambulatory Visit: Payer: Self-pay

## 2022-07-26 ENCOUNTER — Other Ambulatory Visit: Payer: Self-pay | Admitting: Family

## 2022-07-26 MED ORDER — CONTOUR TEST VI STRP: ORAL_STRIP | 2 refills | Status: AC

## 2022-07-29 ENCOUNTER — Other Ambulatory Visit (HOSPITAL_BASED_OUTPATIENT_CLINIC_OR_DEPARTMENT_OTHER): Payer: Self-pay

## 2022-07-29 ENCOUNTER — Other Ambulatory Visit (HOSPITAL_COMMUNITY): Payer: Self-pay

## 2022-07-29 MED ORDER — MICROLET LANCETS MISC
2 refills | Status: AC
  Filled 2022-07-29: qty 100, 50d supply, fill #0
  Filled 2022-10-18: qty 100, 50d supply, fill #1

## 2022-07-29 MED ORDER — INSULIN DETEMIR 100 UNIT/ML FLEXPEN
13.0000 [IU] | PEN_INJECTOR | Freq: Every day | SUBCUTANEOUS | 0 refills | Status: AC
  Filled 2022-07-29: qty 3, 23d supply, fill #0

## 2022-08-28 ENCOUNTER — Other Ambulatory Visit (HOSPITAL_BASED_OUTPATIENT_CLINIC_OR_DEPARTMENT_OTHER): Payer: Self-pay

## 2022-08-28 MED ORDER — INSULIN DETEMIR 100 UNIT/ML FLEXPEN
10.0000 [IU] | PEN_INJECTOR | Freq: Every evening | SUBCUTANEOUS | 0 refills | Status: DC
  Filled 2022-08-28: qty 3, 30d supply, fill #0

## 2022-09-11 ENCOUNTER — Other Ambulatory Visit: Payer: Self-pay

## 2022-09-11 ENCOUNTER — Other Ambulatory Visit (HOSPITAL_BASED_OUTPATIENT_CLINIC_OR_DEPARTMENT_OTHER): Payer: Self-pay

## 2022-09-11 MED ORDER — CONTOUR NEXT TEST VI STRP
ORAL_STRIP | 5 refills | Status: AC
  Filled 2022-09-11: qty 100, 30d supply, fill #0

## 2022-09-11 MED ORDER — OLMESARTAN MEDOXOMIL-HCTZ 40-25 MG PO TABS
1.0000 | ORAL_TABLET | Freq: Every day | ORAL | 0 refills | Status: DC
  Filled 2022-09-11: qty 90, 90d supply, fill #0

## 2022-09-11 MED ORDER — ATORVASTATIN CALCIUM 20 MG PO TABS
20.0000 mg | ORAL_TABLET | Freq: Every day | ORAL | 0 refills | Status: DC
  Filled 2022-09-11: qty 90, 90d supply, fill #0

## 2022-09-11 MED ORDER — METFORMIN HCL ER 500 MG PO TB24
1000.0000 mg | ORAL_TABLET | Freq: Two times a day (BID) | ORAL | 0 refills | Status: DC
  Filled 2022-09-11: qty 360, 90d supply, fill #0

## 2022-09-11 MED ORDER — ARMODAFINIL 50 MG PO TABS
50.0000 mg | ORAL_TABLET | Freq: Every day | ORAL | 0 refills | Status: AC
  Filled 2022-09-11: qty 30, 30d supply, fill #0

## 2022-09-11 MED ORDER — VITAMIN D (ERGOCALCIFEROL) 1.25 MG (50000 UNIT) PO CAPS
50000.0000 [IU] | ORAL_CAPSULE | ORAL | 3 refills | Status: AC
  Filled 2022-09-11: qty 12, 84d supply, fill #0
  Filled 2022-10-18: qty 6, 42d supply, fill #1
  Filled 2023-05-29: qty 6, 42d supply, fill #2
  Filled 2023-05-29: qty 12, 84d supply, fill #0

## 2022-09-11 MED ORDER — LEVEMIR FLEXPEN 100 UNIT/ML ~~LOC~~ SOPN
16.0000 [IU] | PEN_INJECTOR | Freq: Every day | SUBCUTANEOUS | 3 refills | Status: AC
  Filled 2022-09-11: qty 3, 18d supply, fill #0
  Filled 2022-10-18: qty 3, 18d supply, fill #1
  Filled 2022-11-04: qty 3, 18d supply, fill #2
  Filled 2022-12-06: qty 3, 18d supply, fill #3

## 2022-09-11 MED ORDER — GLIPIZIDE ER 10 MG PO TB24
10.0000 mg | ORAL_TABLET | Freq: Every day | ORAL | 3 refills | Status: AC
  Filled 2022-09-11: qty 90, 90d supply, fill #0
  Filled 2022-12-06: qty 90, 90d supply, fill #1

## 2022-09-13 ENCOUNTER — Other Ambulatory Visit (HOSPITAL_BASED_OUTPATIENT_CLINIC_OR_DEPARTMENT_OTHER): Payer: Self-pay

## 2022-10-18 ENCOUNTER — Other Ambulatory Visit (HOSPITAL_BASED_OUTPATIENT_CLINIC_OR_DEPARTMENT_OTHER): Payer: Self-pay

## 2022-10-22 ENCOUNTER — Other Ambulatory Visit (HOSPITAL_BASED_OUTPATIENT_CLINIC_OR_DEPARTMENT_OTHER): Payer: Self-pay

## 2022-11-04 ENCOUNTER — Other Ambulatory Visit (HOSPITAL_BASED_OUTPATIENT_CLINIC_OR_DEPARTMENT_OTHER): Payer: Self-pay

## 2022-12-06 ENCOUNTER — Other Ambulatory Visit (HOSPITAL_BASED_OUTPATIENT_CLINIC_OR_DEPARTMENT_OTHER): Payer: Self-pay

## 2022-12-09 ENCOUNTER — Other Ambulatory Visit (HOSPITAL_BASED_OUTPATIENT_CLINIC_OR_DEPARTMENT_OTHER): Payer: Self-pay

## 2022-12-09 MED ORDER — ATORVASTATIN CALCIUM 20 MG PO TABS
20.0000 mg | ORAL_TABLET | Freq: Every day | ORAL | 0 refills | Status: DC
  Filled 2022-12-09 – 2022-12-20 (×2): qty 90, 90d supply, fill #0

## 2022-12-19 ENCOUNTER — Other Ambulatory Visit (HOSPITAL_BASED_OUTPATIENT_CLINIC_OR_DEPARTMENT_OTHER): Payer: Self-pay

## 2022-12-20 ENCOUNTER — Other Ambulatory Visit (HOSPITAL_COMMUNITY): Payer: Self-pay

## 2022-12-20 ENCOUNTER — Other Ambulatory Visit (HOSPITAL_BASED_OUTPATIENT_CLINIC_OR_DEPARTMENT_OTHER): Payer: Self-pay

## 2022-12-20 ENCOUNTER — Other Ambulatory Visit: Payer: Self-pay

## 2022-12-30 ENCOUNTER — Other Ambulatory Visit (HOSPITAL_COMMUNITY): Payer: Self-pay

## 2022-12-30 MED ORDER — METFORMIN HCL ER 500 MG PO TB24
ORAL_TABLET | ORAL | 0 refills | Status: AC
  Filled 2022-12-30: qty 120, 30d supply, fill #0

## 2022-12-31 ENCOUNTER — Other Ambulatory Visit (HOSPITAL_BASED_OUTPATIENT_CLINIC_OR_DEPARTMENT_OTHER): Payer: Self-pay

## 2022-12-31 ENCOUNTER — Other Ambulatory Visit (HOSPITAL_COMMUNITY): Payer: Self-pay

## 2023-01-01 ENCOUNTER — Other Ambulatory Visit (HOSPITAL_COMMUNITY): Payer: Self-pay

## 2023-01-01 ENCOUNTER — Other Ambulatory Visit: Payer: Self-pay

## 2023-01-01 MED ORDER — OLMESARTAN MEDOXOMIL-HCTZ 40-25 MG PO TABS
1.0000 | ORAL_TABLET | Freq: Every day | ORAL | 0 refills | Status: DC
  Filled 2023-01-01: qty 90, 90d supply, fill #0

## 2023-03-07 ENCOUNTER — Other Ambulatory Visit (HOSPITAL_COMMUNITY): Payer: Self-pay

## 2023-03-11 ENCOUNTER — Other Ambulatory Visit (HOSPITAL_BASED_OUTPATIENT_CLINIC_OR_DEPARTMENT_OTHER): Payer: Self-pay

## 2023-03-12 ENCOUNTER — Other Ambulatory Visit (HOSPITAL_COMMUNITY): Payer: Self-pay

## 2023-03-12 ENCOUNTER — Other Ambulatory Visit (HOSPITAL_BASED_OUTPATIENT_CLINIC_OR_DEPARTMENT_OTHER): Payer: Self-pay

## 2023-03-12 MED ORDER — METFORMIN HCL ER 500 MG PO TB24
1000.0000 mg | ORAL_TABLET | Freq: Two times a day (BID) | ORAL | 0 refills | Status: DC
  Filled 2023-03-12 (×2): qty 120, 30d supply, fill #0

## 2023-03-28 ENCOUNTER — Other Ambulatory Visit (HOSPITAL_BASED_OUTPATIENT_CLINIC_OR_DEPARTMENT_OTHER): Payer: Self-pay

## 2023-03-28 MED ORDER — CONTOUR NEXT TEST VI STRP
ORAL_STRIP | 5 refills | Status: AC
  Filled 2023-03-28: qty 100, 50d supply, fill #0

## 2023-03-28 MED ORDER — LEVEMIR FLEXPEN 100 UNIT/ML ~~LOC~~ SOPN
16.0000 [IU] | PEN_INJECTOR | Freq: Every day | SUBCUTANEOUS | 3 refills | Status: DC
  Filled 2023-03-28 – 2023-04-17 (×2): qty 3, 18d supply, fill #0
  Filled 2023-04-17 – 2023-05-29 (×2): qty 3, 18d supply, fill #1

## 2023-03-28 MED ORDER — GLIPIZIDE ER 10 MG PO TB24
10.0000 mg | ORAL_TABLET | Freq: Every day | ORAL | 3 refills | Status: AC
  Filled 2023-03-28: qty 90, 90d supply, fill #0
  Filled 2023-07-28: qty 90, 90d supply, fill #1
  Filled 2023-10-22: qty 90, 90d supply, fill #2
  Filled 2024-02-16: qty 90, 90d supply, fill #3

## 2023-03-28 MED ORDER — MICROLET LANCETS MISC
5 refills | Status: AC
  Filled 2023-03-28: qty 200, 90d supply, fill #0

## 2023-03-28 MED ORDER — METFORMIN HCL ER 500 MG PO TB24
1000.0000 mg | ORAL_TABLET | Freq: Two times a day (BID) | ORAL | 0 refills | Status: DC
  Filled 2023-03-28 – 2023-04-17 (×4): qty 120, 30d supply, fill #0

## 2023-03-31 ENCOUNTER — Other Ambulatory Visit (HOSPITAL_BASED_OUTPATIENT_CLINIC_OR_DEPARTMENT_OTHER): Payer: Self-pay

## 2023-04-01 ENCOUNTER — Other Ambulatory Visit (HOSPITAL_BASED_OUTPATIENT_CLINIC_OR_DEPARTMENT_OTHER): Payer: Self-pay

## 2023-04-01 MED ORDER — MOUNJARO 2.5 MG/0.5ML ~~LOC~~ SOAJ
2.5000 mg | SUBCUTANEOUS | 0 refills | Status: DC
  Filled 2023-04-01: qty 2, 28d supply, fill #0

## 2023-04-04 ENCOUNTER — Other Ambulatory Visit (HOSPITAL_BASED_OUTPATIENT_CLINIC_OR_DEPARTMENT_OTHER): Payer: Self-pay

## 2023-04-08 ENCOUNTER — Other Ambulatory Visit (HOSPITAL_BASED_OUTPATIENT_CLINIC_OR_DEPARTMENT_OTHER): Payer: Self-pay

## 2023-04-08 ENCOUNTER — Other Ambulatory Visit (HOSPITAL_COMMUNITY): Payer: Self-pay

## 2023-04-08 MED ORDER — ATORVASTATIN CALCIUM 20 MG PO TABS
20.0000 mg | ORAL_TABLET | Freq: Every day | ORAL | 1 refills | Status: AC
  Filled 2023-04-08 (×2): qty 90, 90d supply, fill #0
  Filled 2023-07-28: qty 90, 90d supply, fill #1

## 2023-04-09 ENCOUNTER — Other Ambulatory Visit: Payer: Self-pay

## 2023-04-09 ENCOUNTER — Other Ambulatory Visit (HOSPITAL_BASED_OUTPATIENT_CLINIC_OR_DEPARTMENT_OTHER): Payer: Self-pay

## 2023-04-11 ENCOUNTER — Other Ambulatory Visit (HOSPITAL_COMMUNITY): Payer: Self-pay

## 2023-04-17 ENCOUNTER — Other Ambulatory Visit: Payer: Self-pay

## 2023-04-17 ENCOUNTER — Other Ambulatory Visit (HOSPITAL_BASED_OUTPATIENT_CLINIC_OR_DEPARTMENT_OTHER): Payer: Self-pay

## 2023-04-17 ENCOUNTER — Other Ambulatory Visit (HOSPITAL_COMMUNITY): Payer: Self-pay

## 2023-05-01 ENCOUNTER — Other Ambulatory Visit (HOSPITAL_COMMUNITY): Payer: Self-pay

## 2023-05-01 ENCOUNTER — Other Ambulatory Visit (HOSPITAL_BASED_OUTPATIENT_CLINIC_OR_DEPARTMENT_OTHER): Payer: Self-pay

## 2023-05-01 MED ORDER — MOUNJARO 2.5 MG/0.5ML ~~LOC~~ SOAJ
2.5000 mg | SUBCUTANEOUS | 0 refills | Status: DC
  Filled 2023-05-01: qty 2, 28d supply, fill #0

## 2023-05-01 MED ORDER — OLMESARTAN MEDOXOMIL-HCTZ 40-25 MG PO TABS
1.0000 | ORAL_TABLET | Freq: Every day | ORAL | 0 refills | Status: DC
  Filled 2023-05-01 (×2): qty 90, 90d supply, fill #0

## 2023-05-05 ENCOUNTER — Other Ambulatory Visit: Payer: Self-pay

## 2023-05-05 ENCOUNTER — Other Ambulatory Visit (HOSPITAL_BASED_OUTPATIENT_CLINIC_OR_DEPARTMENT_OTHER): Payer: Self-pay

## 2023-05-06 ENCOUNTER — Other Ambulatory Visit (HOSPITAL_BASED_OUTPATIENT_CLINIC_OR_DEPARTMENT_OTHER): Payer: Self-pay

## 2023-05-07 ENCOUNTER — Other Ambulatory Visit (HOSPITAL_BASED_OUTPATIENT_CLINIC_OR_DEPARTMENT_OTHER): Payer: Self-pay

## 2023-05-07 ENCOUNTER — Other Ambulatory Visit: Payer: Self-pay

## 2023-05-07 MED ORDER — MOUNJARO 5 MG/0.5ML ~~LOC~~ SOAJ
5.0000 mg | SUBCUTANEOUS | 0 refills | Status: DC
  Filled 2023-05-07 (×2): qty 2, 28d supply, fill #0

## 2023-05-12 ENCOUNTER — Other Ambulatory Visit (HOSPITAL_BASED_OUTPATIENT_CLINIC_OR_DEPARTMENT_OTHER): Payer: Self-pay

## 2023-05-12 MED ORDER — ALBUTEROL SULFATE HFA 108 (90 BASE) MCG/ACT IN AERS
2.0000 | INHALATION_SPRAY | Freq: Four times a day (QID) | RESPIRATORY_TRACT | 0 refills | Status: AC | PRN
  Filled 2023-05-12: qty 6.7, 19d supply, fill #0

## 2023-05-13 ENCOUNTER — Other Ambulatory Visit (HOSPITAL_BASED_OUTPATIENT_CLINIC_OR_DEPARTMENT_OTHER): Payer: Self-pay

## 2023-05-13 MED ORDER — ONDANSETRON 4 MG PO TBDP
4.0000 mg | ORAL_TABLET | Freq: Three times a day (TID) | ORAL | 0 refills | Status: DC | PRN
  Filled 2023-05-13 – 2023-05-14 (×2): qty 20, 7d supply, fill #0

## 2023-05-14 ENCOUNTER — Other Ambulatory Visit (HOSPITAL_BASED_OUTPATIENT_CLINIC_OR_DEPARTMENT_OTHER): Payer: Self-pay

## 2023-05-14 ENCOUNTER — Other Ambulatory Visit (HOSPITAL_COMMUNITY): Payer: Self-pay

## 2023-05-14 ENCOUNTER — Other Ambulatory Visit: Payer: Self-pay

## 2023-05-14 MED ORDER — OMEPRAZOLE 20 MG PO CPDR
20.0000 mg | DELAYED_RELEASE_CAPSULE | Freq: Every morning | ORAL | 3 refills | Status: DC
  Filled 2023-05-14: qty 90, 90d supply, fill #0
  Filled 2023-05-29 (×2): qty 30, 30d supply, fill #0

## 2023-05-16 ENCOUNTER — Other Ambulatory Visit (HOSPITAL_COMMUNITY): Payer: Self-pay

## 2023-05-16 ENCOUNTER — Other Ambulatory Visit (HOSPITAL_BASED_OUTPATIENT_CLINIC_OR_DEPARTMENT_OTHER): Payer: Self-pay

## 2023-05-29 ENCOUNTER — Other Ambulatory Visit (HOSPITAL_COMMUNITY): Payer: Self-pay

## 2023-05-29 ENCOUNTER — Other Ambulatory Visit: Payer: Self-pay

## 2023-05-29 ENCOUNTER — Encounter (HOSPITAL_BASED_OUTPATIENT_CLINIC_OR_DEPARTMENT_OTHER): Payer: Self-pay

## 2023-05-29 ENCOUNTER — Other Ambulatory Visit (HOSPITAL_BASED_OUTPATIENT_CLINIC_OR_DEPARTMENT_OTHER): Payer: Self-pay

## 2023-05-29 MED ORDER — MOUNJARO 5 MG/0.5ML ~~LOC~~ SOAJ
SUBCUTANEOUS | 0 refills | Status: DC
  Filled 2023-05-29 – 2023-05-30 (×3): qty 2, 28d supply, fill #0

## 2023-05-29 MED ORDER — METFORMIN HCL ER 500 MG PO TB24
1000.0000 mg | ORAL_TABLET | Freq: Two times a day (BID) | ORAL | 0 refills | Status: DC
  Filled 2023-05-30 (×2): qty 120, 30d supply, fill #0

## 2023-05-30 ENCOUNTER — Other Ambulatory Visit (HOSPITAL_COMMUNITY): Payer: Self-pay

## 2023-05-30 ENCOUNTER — Other Ambulatory Visit: Payer: Self-pay

## 2023-06-03 ENCOUNTER — Other Ambulatory Visit (HOSPITAL_BASED_OUTPATIENT_CLINIC_OR_DEPARTMENT_OTHER): Payer: Self-pay

## 2023-06-03 MED ORDER — AMOXICILLIN 875 MG PO TABS
875.0000 mg | ORAL_TABLET | Freq: Two times a day (BID) | ORAL | 0 refills | Status: AC
  Filled 2023-06-03: qty 20, 10d supply, fill #0

## 2023-06-06 ENCOUNTER — Other Ambulatory Visit (HOSPITAL_BASED_OUTPATIENT_CLINIC_OR_DEPARTMENT_OTHER): Payer: Self-pay

## 2023-06-06 ENCOUNTER — Other Ambulatory Visit: Payer: Self-pay

## 2023-06-06 ENCOUNTER — Other Ambulatory Visit (HOSPITAL_COMMUNITY): Payer: Self-pay

## 2023-06-06 MED ORDER — INSULIN GLARGINE SOLOSTAR 100 UNIT/ML ~~LOC~~ SOPN
16.0000 [IU] | PEN_INJECTOR | Freq: Every day | SUBCUTANEOUS | 3 refills | Status: AC
  Filled 2023-06-06: qty 15, 94d supply, fill #0
  Filled 2023-06-06: qty 15, 93d supply, fill #0
  Filled 2023-08-29: qty 15, 93d supply, fill #1
  Filled 2024-04-02 – 2024-04-05 (×2): qty 15, 93d supply, fill #2

## 2023-06-12 ENCOUNTER — Other Ambulatory Visit (HOSPITAL_BASED_OUTPATIENT_CLINIC_OR_DEPARTMENT_OTHER): Payer: Self-pay

## 2023-06-12 MED ORDER — INSULIN PEN NEEDLE 32G X 4 MM MISC
1.0000 | Freq: Every day | 3 refills | Status: AC
  Filled 2023-10-17: qty 100, 30d supply, fill #0

## 2023-06-12 MED ORDER — INSULIN PEN NEEDLE 32G X 4 MM MISC
1.0000 | Freq: Every day | 3 refills | Status: DC
  Filled 2023-06-12: qty 100, 30d supply, fill #0
  Filled 2023-10-17: qty 100, 30d supply, fill #1
  Filled 2023-10-17: qty 100, 30d supply, fill #0

## 2023-06-13 ENCOUNTER — Other Ambulatory Visit (HOSPITAL_BASED_OUTPATIENT_CLINIC_OR_DEPARTMENT_OTHER): Payer: Self-pay

## 2023-06-23 ENCOUNTER — Other Ambulatory Visit: Payer: Self-pay

## 2023-06-23 ENCOUNTER — Other Ambulatory Visit (HOSPITAL_COMMUNITY): Payer: Self-pay

## 2023-06-23 MED ORDER — MOUNJARO 5 MG/0.5ML ~~LOC~~ SOAJ
5.0000 mg | SUBCUTANEOUS | 0 refills | Status: DC
  Filled 2023-06-23: qty 2, 28d supply, fill #0

## 2023-06-27 ENCOUNTER — Other Ambulatory Visit (HOSPITAL_COMMUNITY): Payer: Self-pay

## 2023-06-30 ENCOUNTER — Other Ambulatory Visit (HOSPITAL_COMMUNITY): Payer: Self-pay

## 2023-06-30 ENCOUNTER — Other Ambulatory Visit (HOSPITAL_BASED_OUTPATIENT_CLINIC_OR_DEPARTMENT_OTHER): Payer: Self-pay

## 2023-06-30 MED ORDER — METFORMIN HCL ER 500 MG PO TB24
1000.0000 mg | ORAL_TABLET | Freq: Two times a day (BID) | ORAL | 1 refills | Status: DC
  Filled 2023-06-30: qty 120, 30d supply, fill #0
  Filled 2023-07-28: qty 120, 30d supply, fill #1

## 2023-07-28 ENCOUNTER — Other Ambulatory Visit: Payer: Self-pay

## 2023-07-28 ENCOUNTER — Other Ambulatory Visit (HOSPITAL_COMMUNITY): Payer: Self-pay

## 2023-07-31 ENCOUNTER — Other Ambulatory Visit: Payer: Self-pay

## 2023-07-31 ENCOUNTER — Other Ambulatory Visit (HOSPITAL_COMMUNITY): Payer: Self-pay

## 2023-07-31 MED ORDER — MOUNJARO 5 MG/0.5ML ~~LOC~~ SOAJ
5.0000 mg | SUBCUTANEOUS | 0 refills | Status: DC
  Filled 2023-07-31: qty 2, 28d supply, fill #0

## 2023-08-14 ENCOUNTER — Other Ambulatory Visit: Payer: Self-pay

## 2023-08-14 ENCOUNTER — Other Ambulatory Visit (HOSPITAL_BASED_OUTPATIENT_CLINIC_OR_DEPARTMENT_OTHER): Payer: Self-pay

## 2023-08-14 MED ORDER — OLMESARTAN MEDOXOMIL-HCTZ 40-12.5 MG PO TABS
1.0000 | ORAL_TABLET | Freq: Every day | ORAL | 3 refills | Status: AC
  Filled 2023-08-14: qty 90, 90d supply, fill #0
  Filled 2023-11-06 – 2023-11-10 (×2): qty 90, 90d supply, fill #1
  Filled 2024-02-16: qty 90, 90d supply, fill #2

## 2023-08-14 MED ORDER — MOUNJARO 7.5 MG/0.5ML ~~LOC~~ SOAJ
7.5000 mg | SUBCUTANEOUS | 0 refills | Status: DC
  Filled 2023-08-14 – 2023-08-29 (×3): qty 2, 28d supply, fill #0

## 2023-08-15 ENCOUNTER — Other Ambulatory Visit (HOSPITAL_BASED_OUTPATIENT_CLINIC_OR_DEPARTMENT_OTHER): Payer: Self-pay

## 2023-08-29 ENCOUNTER — Other Ambulatory Visit (HOSPITAL_BASED_OUTPATIENT_CLINIC_OR_DEPARTMENT_OTHER): Payer: Self-pay

## 2023-08-29 MED ORDER — METFORMIN HCL ER 500 MG PO TB24
1000.0000 mg | ORAL_TABLET | Freq: Two times a day (BID) | ORAL | 1 refills | Status: DC
Start: 2023-08-29 — End: 2023-11-06
  Filled 2023-08-29: qty 120, 30d supply, fill #0
  Filled 2023-09-26: qty 120, 30d supply, fill #1
  Filled 2023-09-29: qty 120, 30d supply, fill #0

## 2023-09-02 ENCOUNTER — Other Ambulatory Visit (HOSPITAL_COMMUNITY): Payer: Self-pay

## 2023-09-02 ENCOUNTER — Other Ambulatory Visit: Payer: Self-pay

## 2023-09-02 ENCOUNTER — Other Ambulatory Visit (HOSPITAL_BASED_OUTPATIENT_CLINIC_OR_DEPARTMENT_OTHER): Payer: Self-pay

## 2023-09-02 MED ORDER — ONDANSETRON 4 MG PO TBDP
4.0000 mg | ORAL_TABLET | Freq: Three times a day (TID) | ORAL | 0 refills | Status: DC | PRN
Start: 2023-09-02 — End: 2023-12-26
  Filled 2023-09-02: qty 20, 7d supply, fill #0

## 2023-09-26 ENCOUNTER — Other Ambulatory Visit: Payer: Self-pay

## 2023-09-26 ENCOUNTER — Other Ambulatory Visit (HOSPITAL_BASED_OUTPATIENT_CLINIC_OR_DEPARTMENT_OTHER): Payer: Self-pay

## 2023-09-26 MED ORDER — MOUNJARO 7.5 MG/0.5ML ~~LOC~~ SOAJ
7.5000 mg | SUBCUTANEOUS | 0 refills | Status: DC
Start: 2023-09-26 — End: 2023-10-22
  Filled 2023-09-26 – 2023-09-29 (×2): qty 2, 28d supply, fill #0

## 2023-09-29 ENCOUNTER — Other Ambulatory Visit (HOSPITAL_COMMUNITY): Payer: Self-pay

## 2023-09-29 ENCOUNTER — Other Ambulatory Visit: Payer: Self-pay

## 2023-09-29 ENCOUNTER — Other Ambulatory Visit (HOSPITAL_BASED_OUTPATIENT_CLINIC_OR_DEPARTMENT_OTHER): Payer: Self-pay

## 2023-10-17 ENCOUNTER — Other Ambulatory Visit (HOSPITAL_COMMUNITY): Payer: Self-pay

## 2023-10-17 ENCOUNTER — Other Ambulatory Visit (HOSPITAL_BASED_OUTPATIENT_CLINIC_OR_DEPARTMENT_OTHER): Payer: Self-pay

## 2023-10-18 ENCOUNTER — Other Ambulatory Visit (HOSPITAL_COMMUNITY): Payer: Self-pay

## 2023-10-21 ENCOUNTER — Other Ambulatory Visit: Payer: Self-pay

## 2023-10-22 ENCOUNTER — Other Ambulatory Visit: Payer: Self-pay

## 2023-10-22 ENCOUNTER — Other Ambulatory Visit (HOSPITAL_COMMUNITY): Payer: Self-pay

## 2023-10-22 ENCOUNTER — Other Ambulatory Visit (HOSPITAL_BASED_OUTPATIENT_CLINIC_OR_DEPARTMENT_OTHER): Payer: Self-pay

## 2023-10-22 MED ORDER — MOUNJARO 7.5 MG/0.5ML ~~LOC~~ SOAJ
7.5000 mg | SUBCUTANEOUS | 0 refills | Status: DC
  Filled 2023-10-22: qty 2, 28d supply, fill #0

## 2023-11-06 ENCOUNTER — Other Ambulatory Visit (HOSPITAL_BASED_OUTPATIENT_CLINIC_OR_DEPARTMENT_OTHER): Payer: Self-pay

## 2023-11-06 MED ORDER — METFORMIN HCL ER 500 MG PO TB24
1000.0000 mg | ORAL_TABLET | Freq: Two times a day (BID) | ORAL | 1 refills | Status: DC
  Filled 2023-11-06 – 2023-11-10 (×2): qty 120, 30d supply, fill #0
  Filled 2023-12-19: qty 120, 30d supply, fill #1
  Filled 2023-12-19: qty 120, 30d supply, fill #0

## 2023-11-10 ENCOUNTER — Other Ambulatory Visit (HOSPITAL_COMMUNITY): Payer: Self-pay

## 2023-11-10 ENCOUNTER — Other Ambulatory Visit (HOSPITAL_BASED_OUTPATIENT_CLINIC_OR_DEPARTMENT_OTHER): Payer: Self-pay

## 2023-11-12 ENCOUNTER — Other Ambulatory Visit (HOSPITAL_BASED_OUTPATIENT_CLINIC_OR_DEPARTMENT_OTHER): Payer: Self-pay

## 2023-11-17 ENCOUNTER — Other Ambulatory Visit (HOSPITAL_BASED_OUTPATIENT_CLINIC_OR_DEPARTMENT_OTHER): Payer: Self-pay

## 2023-11-19 ENCOUNTER — Other Ambulatory Visit (HOSPITAL_BASED_OUTPATIENT_CLINIC_OR_DEPARTMENT_OTHER): Payer: Self-pay

## 2023-11-19 MED ORDER — MOUNJARO 10 MG/0.5ML ~~LOC~~ SOAJ
10.0000 mg | SUBCUTANEOUS | 0 refills | Status: DC
  Filled 2023-11-19 – 2023-11-20 (×2): qty 2, 28d supply, fill #0

## 2023-11-20 ENCOUNTER — Other Ambulatory Visit (HOSPITAL_COMMUNITY): Payer: Self-pay

## 2023-11-20 ENCOUNTER — Other Ambulatory Visit (HOSPITAL_BASED_OUTPATIENT_CLINIC_OR_DEPARTMENT_OTHER): Payer: Self-pay

## 2023-11-24 ENCOUNTER — Other Ambulatory Visit (HOSPITAL_BASED_OUTPATIENT_CLINIC_OR_DEPARTMENT_OTHER): Payer: Self-pay

## 2023-12-17 ENCOUNTER — Other Ambulatory Visit (HOSPITAL_BASED_OUTPATIENT_CLINIC_OR_DEPARTMENT_OTHER): Payer: Self-pay

## 2023-12-17 ENCOUNTER — Other Ambulatory Visit: Payer: Self-pay

## 2023-12-17 ENCOUNTER — Other Ambulatory Visit (HOSPITAL_COMMUNITY): Payer: Self-pay

## 2023-12-17 MED ORDER — MOUNJARO 10 MG/0.5ML ~~LOC~~ SOAJ
10.0000 mg | SUBCUTANEOUS | 0 refills | Status: DC
  Filled 2023-12-17 (×2): qty 2, 28d supply, fill #0

## 2023-12-19 ENCOUNTER — Other Ambulatory Visit (HOSPITAL_BASED_OUTPATIENT_CLINIC_OR_DEPARTMENT_OTHER): Payer: Self-pay

## 2023-12-19 ENCOUNTER — Other Ambulatory Visit: Payer: Self-pay

## 2023-12-19 ENCOUNTER — Other Ambulatory Visit (HOSPITAL_COMMUNITY): Payer: Self-pay

## 2023-12-26 ENCOUNTER — Other Ambulatory Visit (HOSPITAL_BASED_OUTPATIENT_CLINIC_OR_DEPARTMENT_OTHER): Payer: Self-pay

## 2023-12-26 MED ORDER — ONDANSETRON 4 MG PO TBDP
ORAL_TABLET | ORAL | 0 refills | Status: DC
  Filled 2023-12-26: qty 20, 7d supply, fill #0

## 2024-01-13 ENCOUNTER — Other Ambulatory Visit (HOSPITAL_BASED_OUTPATIENT_CLINIC_OR_DEPARTMENT_OTHER): Payer: Self-pay

## 2024-01-13 MED ORDER — MOUNJARO 10 MG/0.5ML ~~LOC~~ SOAJ
10.0000 mg | SUBCUTANEOUS | 0 refills | Status: DC
  Filled 2024-01-13 – 2024-01-14 (×3): qty 2, 28d supply, fill #0

## 2024-01-14 ENCOUNTER — Other Ambulatory Visit (HOSPITAL_COMMUNITY): Payer: Self-pay

## 2024-01-14 ENCOUNTER — Other Ambulatory Visit (HOSPITAL_BASED_OUTPATIENT_CLINIC_OR_DEPARTMENT_OTHER): Payer: Self-pay

## 2024-01-14 ENCOUNTER — Other Ambulatory Visit: Payer: Self-pay

## 2024-01-15 ENCOUNTER — Other Ambulatory Visit (HOSPITAL_BASED_OUTPATIENT_CLINIC_OR_DEPARTMENT_OTHER): Payer: Self-pay

## 2024-01-15 MED ORDER — MOUNJARO 10 MG/0.5ML ~~LOC~~ SOAJ
10.0000 mg | SUBCUTANEOUS | 3 refills | Status: DC
  Filled 2024-01-15 – 2024-03-10 (×3): qty 2, 28d supply, fill #0

## 2024-02-02 ENCOUNTER — Other Ambulatory Visit: Payer: Self-pay

## 2024-02-02 ENCOUNTER — Other Ambulatory Visit (HOSPITAL_COMMUNITY): Payer: Self-pay

## 2024-02-02 MED ORDER — METFORMIN HCL ER 500 MG PO TB24
1000.0000 mg | ORAL_TABLET | Freq: Two times a day (BID) | ORAL | 1 refills | Status: DC
  Filled 2024-02-02 – 2024-03-30 (×3): qty 120, 30d supply, fill #0

## 2024-02-16 ENCOUNTER — Other Ambulatory Visit (HOSPITAL_BASED_OUTPATIENT_CLINIC_OR_DEPARTMENT_OTHER): Payer: Self-pay

## 2024-02-16 ENCOUNTER — Other Ambulatory Visit (HOSPITAL_COMMUNITY): Payer: Self-pay

## 2024-02-19 ENCOUNTER — Other Ambulatory Visit (HOSPITAL_COMMUNITY): Payer: Self-pay

## 2024-02-24 ENCOUNTER — Other Ambulatory Visit (HOSPITAL_COMMUNITY): Payer: Self-pay

## 2024-02-26 ENCOUNTER — Other Ambulatory Visit (HOSPITAL_COMMUNITY): Payer: Self-pay

## 2024-03-10 ENCOUNTER — Other Ambulatory Visit: Payer: Self-pay

## 2024-03-22 ENCOUNTER — Other Ambulatory Visit (HOSPITAL_COMMUNITY): Payer: Self-pay

## 2024-03-24 ENCOUNTER — Other Ambulatory Visit (HOSPITAL_COMMUNITY): Payer: Self-pay

## 2024-03-24 ENCOUNTER — Other Ambulatory Visit (HOSPITAL_BASED_OUTPATIENT_CLINIC_OR_DEPARTMENT_OTHER): Payer: Self-pay

## 2024-03-30 ENCOUNTER — Other Ambulatory Visit (HOSPITAL_COMMUNITY): Payer: Self-pay

## 2024-03-30 ENCOUNTER — Other Ambulatory Visit (HOSPITAL_BASED_OUTPATIENT_CLINIC_OR_DEPARTMENT_OTHER): Payer: Self-pay

## 2024-04-02 ENCOUNTER — Other Ambulatory Visit (HOSPITAL_COMMUNITY): Payer: Self-pay

## 2024-04-05 ENCOUNTER — Other Ambulatory Visit (HOSPITAL_BASED_OUTPATIENT_CLINIC_OR_DEPARTMENT_OTHER): Payer: Self-pay

## 2024-04-05 ENCOUNTER — Other Ambulatory Visit (HOSPITAL_COMMUNITY): Payer: Self-pay

## 2024-04-06 ENCOUNTER — Other Ambulatory Visit: Payer: Self-pay

## 2024-04-06 ENCOUNTER — Other Ambulatory Visit (HOSPITAL_COMMUNITY): Payer: Self-pay

## 2024-04-08 ENCOUNTER — Other Ambulatory Visit (HOSPITAL_BASED_OUTPATIENT_CLINIC_OR_DEPARTMENT_OTHER): Payer: Self-pay

## 2024-04-08 MED ORDER — MOUNJARO 12.5 MG/0.5ML ~~LOC~~ SOAJ
12.5000 mg | SUBCUTANEOUS | 0 refills | Status: DC
  Filled 2024-04-08: qty 2, 28d supply, fill #0

## 2024-04-09 ENCOUNTER — Other Ambulatory Visit (HOSPITAL_BASED_OUTPATIENT_CLINIC_OR_DEPARTMENT_OTHER): Payer: Self-pay

## 2024-05-12 ENCOUNTER — Other Ambulatory Visit (HOSPITAL_BASED_OUTPATIENT_CLINIC_OR_DEPARTMENT_OTHER): Payer: Self-pay

## 2024-05-12 MED ORDER — METFORMIN HCL ER 500 MG PO TB24
1000.0000 mg | ORAL_TABLET | Freq: Two times a day (BID) | ORAL | 1 refills | Status: AC
  Filled 2024-05-12 – 2024-05-18 (×2): qty 120, 30d supply, fill #0
  Filled 2024-06-25: qty 120, 30d supply, fill #1

## 2024-05-18 ENCOUNTER — Other Ambulatory Visit (HOSPITAL_BASED_OUTPATIENT_CLINIC_OR_DEPARTMENT_OTHER): Payer: Self-pay

## 2024-05-18 ENCOUNTER — Other Ambulatory Visit (HOSPITAL_COMMUNITY): Payer: Self-pay

## 2024-05-18 ENCOUNTER — Other Ambulatory Visit: Payer: Self-pay

## 2024-05-18 MED ORDER — MOUNJARO 12.5 MG/0.5ML ~~LOC~~ SOAJ
12.5000 mg | SUBCUTANEOUS | 0 refills | Status: DC
  Filled 2024-05-18 – 2024-05-19 (×3): qty 2, 28d supply, fill #0

## 2024-05-19 ENCOUNTER — Other Ambulatory Visit (HOSPITAL_COMMUNITY): Payer: Self-pay

## 2024-05-19 ENCOUNTER — Other Ambulatory Visit (HOSPITAL_BASED_OUTPATIENT_CLINIC_OR_DEPARTMENT_OTHER): Payer: Self-pay

## 2024-05-21 ENCOUNTER — Other Ambulatory Visit (HOSPITAL_BASED_OUTPATIENT_CLINIC_OR_DEPARTMENT_OTHER): Payer: Self-pay

## 2024-05-24 ENCOUNTER — Other Ambulatory Visit: Payer: Self-pay

## 2024-06-08 ENCOUNTER — Other Ambulatory Visit (HOSPITAL_BASED_OUTPATIENT_CLINIC_OR_DEPARTMENT_OTHER): Payer: Self-pay

## 2024-06-09 ENCOUNTER — Other Ambulatory Visit (HOSPITAL_BASED_OUTPATIENT_CLINIC_OR_DEPARTMENT_OTHER): Payer: Self-pay

## 2024-06-09 MED ORDER — ONDANSETRON 4 MG PO TBDP
4.0000 mg | ORAL_TABLET | Freq: Three times a day (TID) | ORAL | 0 refills | Status: AC | PRN
  Filled 2024-06-09: qty 20, 7d supply, fill #0
  Filled 2024-06-15 (×2): qty 20, 20d supply, fill #0

## 2024-06-15 ENCOUNTER — Other Ambulatory Visit: Payer: Self-pay

## 2024-06-17 ENCOUNTER — Other Ambulatory Visit (HOSPITAL_COMMUNITY): Payer: Self-pay

## 2024-06-17 MED ORDER — BD PEN NEEDLE NANO ULTRAFINE 32G X 4 MM MISC
3 refills | Status: AC
  Filled 2024-06-17 – 2024-06-18 (×2): qty 100, 90d supply, fill #0

## 2024-06-18 ENCOUNTER — Other Ambulatory Visit: Payer: Self-pay

## 2024-06-18 ENCOUNTER — Other Ambulatory Visit (HOSPITAL_BASED_OUTPATIENT_CLINIC_OR_DEPARTMENT_OTHER): Payer: Self-pay

## 2024-06-18 ENCOUNTER — Other Ambulatory Visit (HOSPITAL_COMMUNITY): Payer: Self-pay

## 2024-06-21 ENCOUNTER — Other Ambulatory Visit: Payer: Self-pay

## 2024-06-21 ENCOUNTER — Other Ambulatory Visit (HOSPITAL_COMMUNITY): Payer: Self-pay

## 2024-06-21 ENCOUNTER — Other Ambulatory Visit (HOSPITAL_BASED_OUTPATIENT_CLINIC_OR_DEPARTMENT_OTHER): Payer: Self-pay

## 2024-06-22 ENCOUNTER — Other Ambulatory Visit (HOSPITAL_BASED_OUTPATIENT_CLINIC_OR_DEPARTMENT_OTHER): Payer: Self-pay

## 2024-06-22 ENCOUNTER — Encounter (HOSPITAL_BASED_OUTPATIENT_CLINIC_OR_DEPARTMENT_OTHER): Payer: Self-pay

## 2024-06-22 MED ORDER — MOUNJARO 12.5 MG/0.5ML ~~LOC~~ SOAJ
SUBCUTANEOUS | 0 refills | Status: AC
  Filled 2024-06-22 – 2024-06-24 (×2): qty 2, 28d supply, fill #0

## 2024-06-24 ENCOUNTER — Other Ambulatory Visit (HOSPITAL_BASED_OUTPATIENT_CLINIC_OR_DEPARTMENT_OTHER): Payer: Self-pay

## 2024-06-25 ENCOUNTER — Other Ambulatory Visit (HOSPITAL_COMMUNITY): Payer: Self-pay

## 2024-07-01 ENCOUNTER — Other Ambulatory Visit: Payer: Self-pay
